# Patient Record
Sex: Female | Born: 1975 | Race: White | Hispanic: No | Marital: Married | Smoking: Former smoker
Health system: Southern US, Community
[De-identification: ages and names within clinical notes are randomized; demographics above are authoritative.]

## PROBLEM LIST (undated history)

## (undated) DIAGNOSIS — L719 Rosacea, unspecified: Secondary | ICD-10-CM

## (undated) DIAGNOSIS — E282 Polycystic ovarian syndrome: Secondary | ICD-10-CM

## (undated) DIAGNOSIS — N946 Dysmenorrhea, unspecified: Secondary | ICD-10-CM

## (undated) DIAGNOSIS — C439 Malignant melanoma of skin, unspecified: Secondary | ICD-10-CM

## (undated) DIAGNOSIS — O2230 Deep phlebothrombosis in pregnancy, unspecified trimester: Secondary | ICD-10-CM

## (undated) DIAGNOSIS — E079 Disorder of thyroid, unspecified: Secondary | ICD-10-CM

## (undated) DIAGNOSIS — N979 Female infertility, unspecified: Secondary | ICD-10-CM

## (undated) HISTORY — DX: Rosacea, unspecified: L71.9

## (undated) HISTORY — DX: Dysmenorrhea, unspecified: N94.6

## (undated) HISTORY — DX: Malignant melanoma of skin, unspecified: C43.9

## (undated) HISTORY — DX: Disorder of thyroid, unspecified: E07.9

## (undated) HISTORY — DX: Female infertility, unspecified: N97.9

## (undated) HISTORY — DX: Polycystic ovarian syndrome: E28.2

---

## 2005-10-19 ENCOUNTER — Other Ambulatory Visit: Admission: RE | Admit: 2005-10-19 | Discharge: 2005-10-19 | Payer: Self-pay | Admitting: Obstetrics and Gynecology

## 2006-09-19 DIAGNOSIS — E282 Polycystic ovarian syndrome: Secondary | ICD-10-CM

## 2006-09-19 DIAGNOSIS — N979 Female infertility, unspecified: Secondary | ICD-10-CM

## 2006-09-19 HISTORY — DX: Female infertility, unspecified: N97.9

## 2006-09-19 HISTORY — DX: Polycystic ovarian syndrome: E28.2

## 2009-04-29 ENCOUNTER — Inpatient Hospital Stay (HOSPITAL_COMMUNITY): Admission: AD | Admit: 2009-04-29 | Discharge: 2009-04-29 | Payer: Self-pay | Admitting: Obstetrics and Gynecology

## 2009-11-26 ENCOUNTER — Inpatient Hospital Stay (HOSPITAL_COMMUNITY): Admission: RE | Admit: 2009-11-26 | Discharge: 2009-11-29 | Payer: Self-pay | Admitting: Obstetrics and Gynecology

## 2010-09-19 HISTORY — PX: VEIN SURGERY: SHX48

## 2010-12-13 LAB — CBC
HCT: 33.5 % — ABNORMAL LOW (ref 36.0–46.0)
HCT: 38.1 % (ref 36.0–46.0)
Hemoglobin: 11.4 g/dL — ABNORMAL LOW (ref 12.0–15.0)
Hemoglobin: 12.8 g/dL (ref 12.0–15.0)
MCHC: 33.7 g/dL (ref 30.0–36.0)
MCV: 97.4 fL (ref 78.0–100.0)
Platelets: 155 10*3/uL (ref 150–400)
RDW: 14.1 % (ref 11.5–15.5)
WBC: 11.2 10*3/uL — ABNORMAL HIGH (ref 4.0–10.5)

## 2010-12-13 LAB — URINALYSIS, ROUTINE W REFLEX MICROSCOPIC
Bilirubin Urine: NEGATIVE
Glucose, UA: NEGATIVE mg/dL
Ketones, ur: NEGATIVE mg/dL
Protein, ur: NEGATIVE mg/dL

## 2010-12-13 LAB — CCBB MATERNAL DONOR DRAW

## 2010-12-13 LAB — URINE MICROSCOPIC-ADD ON

## 2010-12-26 LAB — URINALYSIS, DIPSTICK ONLY
Glucose, UA: NEGATIVE mg/dL
Hgb urine dipstick: NEGATIVE
Leukocytes, UA: NEGATIVE
Nitrite: NEGATIVE
Protein, ur: NEGATIVE mg/dL

## 2011-04-27 ENCOUNTER — Other Ambulatory Visit: Payer: Self-pay | Admitting: Internal Medicine

## 2011-04-27 DIAGNOSIS — E049 Nontoxic goiter, unspecified: Secondary | ICD-10-CM

## 2011-06-07 ENCOUNTER — Other Ambulatory Visit: Payer: Self-pay

## 2011-07-01 ENCOUNTER — Ambulatory Visit
Admission: RE | Admit: 2011-07-01 | Discharge: 2011-07-01 | Disposition: A | Discharge status: Home / Self Care | Payer: 59 | Source: Ambulatory Visit | Attending: Internal Medicine | Admitting: Internal Medicine

## 2011-07-01 DIAGNOSIS — E049 Nontoxic goiter, unspecified: Secondary | ICD-10-CM

## 2011-12-21 ENCOUNTER — Encounter (INDEPENDENT_AMBULATORY_CARE_PROVIDER_SITE_OTHER): Payer: 59 | Admitting: Surgery

## 2012-01-09 ENCOUNTER — Ambulatory Visit (INDEPENDENT_AMBULATORY_CARE_PROVIDER_SITE_OTHER): Payer: 59 | Admitting: General Surgery

## 2013-09-10 ENCOUNTER — Ambulatory Visit: Payer: Self-pay | Admitting: Obstetrics and Gynecology

## 2013-10-28 ENCOUNTER — Encounter: Payer: Self-pay | Admitting: Obstetrics and Gynecology

## 2013-11-12 ENCOUNTER — Ambulatory Visit (INDEPENDENT_AMBULATORY_CARE_PROVIDER_SITE_OTHER): Payer: 59 | Admitting: Obstetrics and Gynecology

## 2013-11-12 ENCOUNTER — Encounter: Payer: Self-pay | Admitting: Obstetrics and Gynecology

## 2013-11-12 VITALS — BP 125/73 | HR 64 | Resp 16 | Ht 66.25 in | Wt 166.0 lb

## 2013-11-12 DIAGNOSIS — Z01419 Encounter for gynecological examination (general) (routine) without abnormal findings: Secondary | ICD-10-CM

## 2013-11-12 DIAGNOSIS — E039 Hypothyroidism, unspecified: Secondary | ICD-10-CM

## 2013-11-12 NOTE — Patient Instructions (Signed)

## 2013-11-12 NOTE — Progress Notes (Signed)
GYNECOLOGY VISIT  PCP: [facility]  Referring provider:   HPI: 38 y.o.   Married  Caucasian  female   [phone] with Patient's last menstrual period was 11/07/2013.   here for   Annual Exam  Would welcome pregnancy.   First two days of menses with severe cramping.  Takes Tylenol. Allergy to ASA and Ibuprofen. Declines OCPs.  Daughter just diagnosed with Type I diabetes.  Hgb:  PCP Urine:  PCP  GYNECOLOGIC HISTORY: Patient's last menstrual period was 11/07/2013. Sexually active:  yes Partner preference: female Contraception:   Condoms Menopausal hormone therapy: no DES exposure:  no  Blood transfusions:   no Sexually transmitted diseases:   no GYN procedures and prior surgeries:  no Last mammogram:  no               Last pap and high risk HPV testing:  2013  Neg History of abnormal pap smear:  no   OB History   Grav Para Term Preterm Abortions TAB SAB Ect Mult Living   3 2 2  1  1   2        LIFESTYLE: Exercise:    Walking, Swimming, Zumba, Kickball           Tobacco: occ smoking Alcohol: no Drug use:  no  OTHER HEALTH MAINTENANCE: Tetanus/TDap: less than 10 years Gardisil: no Influenza no:   Zostavax: no  Bone density: no Colonoscopy: no  Cholesterol check: no  Family History  Problem Relation Age of Onset  . Thyroid disease Mother     HYPO  . Thyroid disease Father   . Graves' disease Father   . Diabetes Daughter     There are no active problems to display for this patient.  Past Medical History  Diagnosis Date  . Dysmenorrhea   . PCOS (polycystic ovarian syndrome) 2008  . Infertility, female 2008  . Thyroid disease     HYPOTHYOIDISM    Past Surgical History  Procedure Laterality Date  . Cesarean section  11/26/09  . Vein surgery Right 2012    ALLERGIES: Aspirin and Sulfa antibiotics  Current Outpatient Prescriptions  Medication Sig Dispense Refill  . SYNTHROID 112 MCG tablet        No current  facility-administered medications for this visit.     ROS:  Pertinent items are noted in HPI.  SOCIAL HISTORY:  Married.  Two children.  Artist.  Technical brewer.    PHYSICAL EXAMINATION:    BP 125/73  Pulse 64  Resp 16  Ht 5' 6.25" (1.683 m)  Wt 166 lb (75.297 kg)  BMI 26.58 kg/m2  LMP 11/07/2013   Wt Readings from Last 3 Encounters:  11/12/13 166 lb (75.297 kg)     Ht Readings from Last 3 Encounters:  11/12/13 5' 6.25" (1.683 m)    General appearance: alert, cooperative and appears stated age Head: Normocephalic, without obvious abnormality, atraumatic Neck: no adenopathy, supple, symmetrical, trachea midline and thyroid not enlarged, symmetric, no tenderness/mass/nodules Lungs: clear to auscultation bilaterally Breasts: Inspection negative, No nipple retraction or dimpling, No nipple discharge or bleeding, No axillary or supraclavicular adenopathy, Normal to palpation without dominant masses Heart: regular rate and rhythm Abdomen: soft, non-tender; no masses,  no organomegaly Extremities: extremities normal, atraumatic, no cyanosis or edema Skin: Skin color, texture, turgor normal. No rashes or lesions Lymph nodes: Cervical, supraclavicular, and axillary nodes normal. No abnormal inguinal nodes palpated Neurologic: Grossly normal  Pelvic: External genitalia:  no lesions  Urethra:  normal appearing urethra with no masses, tenderness or lesions              Bartholins and Skenes: normal                 Vagina: normal appearing vagina with normal color and discharge, no lesions              Cervix: normal appearance              Pap and high risk HPV testing done: yes.            Bimanual Exam:  Uterus:  uterus is normal size, shape, consistency and nontender                                      Adnexa: normal adnexa in size, nontender and no masses                                        ASSESSMENT  Normal gynecologic exam. Hypothyroidism.   Dysmenorrhea.  Declines OCPS.  PLAN  Mammogram recommended yearly.  Pap smear and high risk HPV testing Counseled on self breast exam, Calcium and vitamin D intake, exercise. Start PNV. Referral to Dr. Roque Cash for hypothyroidism management.   Will do labs at visit with Dr. Forde Dandy.  Return annually or prn   An After Visit Summary was printed and given to the patient.

## 2013-11-13 ENCOUNTER — Telehealth: Payer: Self-pay | Admitting: Obstetrics and Gynecology

## 2013-11-13 NOTE — Telephone Encounter (Signed)
Advised patient that she is scheduled with Dr.South on March 18 @ 2pm.3  Patient agreeable.

## 2013-11-14 LAB — IPS PAP TEST WITH HPV

## 2014-05-23 ENCOUNTER — Telehealth: Payer: Self-pay | Admitting: Obstetrics and Gynecology

## 2014-05-23 NOTE — Telephone Encounter (Signed)
Spoke with patient. She was just seen at urgent care and givenabx for what appears to be an insect bite on her breast. She is unsure what antibiotic was given and for how long she must take it as she is currently waiting for rx.  Advised patient that Dr. Quincy Simmonds can assess breast for follow up. Patient will start antibiotics today. Scheduled patient for office visit for follow up with Dr. Quincy Simmonds on 05/29/14 at 1630. Patient will return call if symptoms worsen or develops fevers, redness or any other concern, verbalized understanding of instructions.   Routing to provider for final review. Patient agreeable to disposition. Will close encounter

## 2014-05-23 NOTE — Telephone Encounter (Signed)
Patient just went to urgent care for a mark on her breast, was given an antibiotic. Patient was told to seek follow up care. Patient is asking if it is appropriate to come to her GYN for this issue?

## 2014-05-29 ENCOUNTER — Encounter: Payer: Self-pay | Admitting: Obstetrics and Gynecology

## 2014-05-29 ENCOUNTER — Ambulatory Visit (INDEPENDENT_AMBULATORY_CARE_PROVIDER_SITE_OTHER): Payer: BC Managed Care – PPO | Admitting: Obstetrics and Gynecology

## 2014-05-29 VITALS — BP 118/78 | HR 72 | Ht 66.0 in | Wt 171.0 lb

## 2014-05-29 DIAGNOSIS — D229 Melanocytic nevi, unspecified: Secondary | ICD-10-CM

## 2014-05-29 DIAGNOSIS — D239 Other benign neoplasm of skin, unspecified: Secondary | ICD-10-CM

## 2014-05-29 DIAGNOSIS — L0291 Cutaneous abscess, unspecified: Secondary | ICD-10-CM

## 2014-05-29 DIAGNOSIS — L0390 Cellulitis, unspecified: Secondary | ICD-10-CM

## 2014-05-29 NOTE — Progress Notes (Signed)
GYNECOLOGY VISIT  PCP:   Referring provider:   HPI: 38 y.o.   Married  Caucasian  female   [phone] with Patient's last menstrual period was 05/20/2014.   here for   Left Breast check (possible spider bite) Went to urgent care and received doxycycline to treat skin infection.  Feeling better.  Shows me a photo of red streaking of the left breast and a ring of erythema around the areola.   Wants to see dermatology for a mole check of left breast and face.  States she has a history of abnormal moles - dysplasia versus cancer of the skin.  Family practitioner removed the moles.   Hgb:  no Urine:  no  GYNECOLOGIC HISTORY: Patient's last menstrual period was 05/20/2014. Sexually active:  yes Partner preference: female Contraception:   No Menopausal hormone therapy: no  DES exposure:   no Blood transfusions:no    Sexually transmitted diseases: never    GYN procedures and prior surgeries: no  Last mammogram:   never              Last pap and high risk HPV testing:   11/12/13  Neg HR HPV History of abnormal pap smear:  no   OB History   Grav Para Term Preterm Abortions TAB SAB Ect Mult Living   3 2 2  1  1   2        LIFESTYLE: Exercise: very active              Tobacco:  no Alcohol:no Drug use:no    There are no active problems to display for this patient.   Past Medical History  Diagnosis Date  . Dysmenorrhea   . PCOS (polycystic ovarian syndrome) 2008  . Infertility, female 2008  . Thyroid disease     HYPOTHYOIDISM    Past Surgical History  Procedure Laterality Date  . Cesarean section  11/26/09  . Vein surgery Right 2012    Current Outpatient Prescriptions  Medication Sig Dispense Refill  . levothyroxine (SYNTHROID, LEVOTHROID) 137 MCG tablet Take 137 mcg by mouth daily before breakfast.       No current facility-administered medications for this visit.     ALLERGIES: Advil; Aspirin; and Sulfa antibiotics  Family History  Problem Relation Age of Onset   . Thyroid disease Mother     HYPO  . Thyroid disease Father   . Graves' disease Father   . Diabetes Daughter     History   Social History  . Marital Status: Married    Spouse Name: N/A    Number of Children: N/A  . Years of Education: N/A   Occupational History  . Not on file.   Social History Main Topics  . Smoking status: Current Some Day Smoker  . Smokeless tobacco: Never Used  . Alcohol Use: No  . Drug Use: No  . Sexual Activity: Yes    Partners: Female    Birth Control/ Protection: Condom   Other Topics Concern  . Not on file   Social History Narrative  . No narrative on file    ROS:  Pertinent items are noted in HPI.  PHYSICAL EXAMINATION:    BP 118/78  Pulse 72  Ht 5\' 6"  (1.676 m)  Wt 171 lb (77.565 kg)  BMI 27.61 kg/m2  LMP 05/20/2014   Wt Readings from Last 3 Encounters:  05/29/14 171 lb (77.565 kg)  11/12/13 166 lb (75.297 kg)     Ht Readings from Last 3  Encounters:  05/29/14 5\' 6"  (1.676 m)  11/12/13 5' 6.25" (1.683 m)    General appearance: alert, cooperative and appears stated age Breasts: 1 cm red cracked, smooth dry skin at 3 o'clock, 4 mm irregular dark brown area adjacent to that area.  No nipple retraction or dimpling, No nipple discharge or bleeding, No axillary or supraclavicular adenopathy, Normal to palpation without dominant masses   ASSESSMENT  Cellulitis of left breast.  Improved.  Pigmented nevus of the left breast.  History of dysplasia of the skin versus skin cancer.  PLAN  Finish doxycycline.  Referral to Dr. Crista Luria.  Return prn.   An After Visit Summary was printed and given to the patient.   15 minutes face to face time of which over 50% was spent in counseling.

## 2014-05-29 NOTE — Patient Instructions (Signed)
Please call us if you do not hear from Dr. Marisue Ivan office or our office in 10 days.

## 2014-06-09 ENCOUNTER — Telehealth: Payer: Self-pay | Admitting: Obstetrics and Gynecology

## 2014-06-09 NOTE — Telephone Encounter (Signed)
Spoke with patient regarding her dermatology referral. Patient is scheduled 09.30.2015 @ 1345. Patient agreeable.

## 2014-07-21 ENCOUNTER — Encounter: Payer: Self-pay | Admitting: Obstetrics and Gynecology

## 2014-11-19 ENCOUNTER — Ambulatory Visit: Payer: 59 | Admitting: Obstetrics and Gynecology

## 2014-11-19 ENCOUNTER — Ambulatory Visit (INDEPENDENT_AMBULATORY_CARE_PROVIDER_SITE_OTHER): Payer: BLUE CROSS/BLUE SHIELD | Admitting: Obstetrics and Gynecology

## 2014-11-19 ENCOUNTER — Encounter: Payer: Self-pay | Admitting: Obstetrics and Gynecology

## 2014-11-19 VITALS — BP 110/74 | HR 68 | Resp 14 | Ht 66.0 in | Wt 175.2 lb

## 2014-11-19 DIAGNOSIS — Z01419 Encounter for gynecological examination (general) (routine) without abnormal findings: Secondary | ICD-10-CM | POA: Diagnosis not present

## 2014-11-19 DIAGNOSIS — Z0000 Encounter for general adult medical examination without abnormal findings: Secondary | ICD-10-CM | POA: Diagnosis not present

## 2014-11-19 LAB — POCT URINALYSIS DIPSTICK
BILIRUBIN UA: NEGATIVE
GLUCOSE UA: NEGATIVE
KETONES UA: NEGATIVE
LEUKOCYTES UA: NEGATIVE
Nitrite, UA: NEGATIVE
PH UA: 5
Protein, UA: NEGATIVE
RBC UA: NEGATIVE
Urobilinogen, UA: NEGATIVE

## 2014-11-19 NOTE — Patient Instructions (Signed)

## 2014-11-19 NOTE — Progress Notes (Signed)
Patient ID: Angel Mills, female   DOB: September 22, 1975, 39 y.o.   MRN: [id number] 39 y.o. [id number] MarriedCaucasianF here for annual exam.   PCP:  Dr. Annie Main South(endocrinologist) Not remembering her Synthroid.  Due to see Dr. Forde Dandy.  No contraception.  Sometimes condoms and spermicide.  [place] if pregnancy occurs.   Continues to have difficult menses.  If looses weight, has less bleeding and less pain.  No NSAIDs due to anaphylaxis with Advil and ASA.  Quit tobacco in October 2015.   Had benign mole removal from breast. [facility].   Daughter doing better with diabetes.   Patient's last menstrual period was 11/13/2014 (exact date).          Sexually active: Yes.   female partner The current method of family planning is none.    Exercising: Yes.    walking, swimming and biking. Smoker:  Former smoker--quit 06/2014  Health Maintenance: Pap:  11-12-13 wnl:neg HR HPV History of abnormal Pap:  no MMG:  n/a Colonoscopy:  n/a BMD:   n/a TDaP:  Within past 8 years Screening Labs:   Hb today: with Endocrine, Urine today: Neg    reports that she has been smoking.  She has never used smokeless tobacco. She reports that she does not drink alcohol or use illicit drugs.  Past Medical History  Diagnosis Date  . Dysmenorrhea   . PCOS (polycystic ovarian syndrome) 2008  . Infertility, female 2008  . Thyroid disease     HYPOTHYOIDISM    Past Surgical History  Procedure Laterality Date  . Cesarean section  11/26/09  . Vein surgery Right 2012    Current Outpatient Prescriptions  Medication Sig Dispense Refill  . levothyroxine (SYNTHROID, LEVOTHROID) 137 MCG tablet Take 137 mcg by mouth daily before breakfast.     No current facility-administered medications for this visit.    Family History  Problem Relation Age of Onset  . Thyroid disease Mother     HYPO  . Thyroid disease Father   . Graves' disease Father   . Diabetes Daughter     ROS:  Pertinent items are noted  in HPI.  Otherwise, a comprehensive ROS was negative.  Exam:   BP 110/74 mmHg  Pulse 68  Resp 14  Ht 5\' 6"  (1.676 m)  Wt 175 lb 3.2 oz (79.47 kg)  BMI 28.29 kg/m2  LMP 11/13/2014 (Exact Date)      Height: 5\' 6"  (167.6 cm)  Ht Readings from Last 3 Encounters:  11/19/14 5\' 6"  (1.676 m)  05/29/14 5\' 6"  (1.676 m)  11/12/13 5' 6.25" (1.683 m)    General appearance: alert, cooperative and appears stated age Head: Normocephalic, without obvious abnormality, atraumatic Neck: no adenopathy, supple, symmetrical, trachea midline and thyroid enlarged Lungs: clear to auscultation bilaterally Breasts: normal appearance, no masses or tenderness, Inspection negative, No nipple retraction or dimpling, No nipple discharge or bleeding, No axillary or supraclavicular adenopathy Heart: regular rate and rhythm Abdomen: soft, non-tender; bowel sounds normal; no masses,  no organomegaly Extremities: extremities normal, atraumatic, no cyanosis or edema Skin: Skin color, texture, turgor normal. No rashes or lesions Lymph nodes: Cervical, supraclavicular, and axillary nodes normal. No abnormal inguinal nodes palpated Neurologic: Grossly normal   Pelvic: External genitalia:  no lesions              Urethra:  normal appearing urethra with no masses, tenderness or lesions              Bartholins and Skenes: normal  Vagina: normal appearing vagina with normal color and discharge, no lesions              Cervix: no lesions              Pap taken: No. Bimanual Exam:  Uterus:  normal size, contour, position, consistency, mobility, non-tender              Adnexa: normal adnexa and no mass, fullness, tenderness               Rectovaginal: Confirms               Anus:  normal sphincter tone, no lesions  Chaperone was present for exam.  A:  Well Woman with normal exam Dysmenorrhea and heavy menstrual cycles.  Anaphylaxis with Advil and ASA. Desire for potential future pregnancy.  History of  C/S.  Goiter and hypothyroidism.   P:   Mammogram age 13.  pap smear not indicated.  Tylenol and heat for painful menses.  Discussed contraception for pregnancy prevention, dysmenorrhea, and reduction of menstrual flow when the time is right for her.  Start PNV.  Take Synthroid daily and set alarm.  Follow up with Dr. Forde Dandy.   return annually or prn

## 2015-12-09 ENCOUNTER — Ambulatory Visit (INDEPENDENT_AMBULATORY_CARE_PROVIDER_SITE_OTHER): Payer: 59 | Admitting: Obstetrics and Gynecology

## 2015-12-09 ENCOUNTER — Encounter: Payer: Self-pay | Admitting: Obstetrics and Gynecology

## 2015-12-09 VITALS — BP 112/74 | HR 86 | Resp 16 | Ht 66.25 in | Wt 183.0 lb

## 2015-12-09 DIAGNOSIS — Z01419 Encounter for gynecological examination (general) (routine) without abnormal findings: Secondary | ICD-10-CM

## 2015-12-09 MED ORDER — NORETHINDRONE 0.35 MG PO TABS: 1.0000 | ORAL_TABLET | Freq: Every day | ORAL | Status: DC

## 2015-12-09 NOTE — Progress Notes (Signed)
Patient ID: Angel Mills, female   DOB: [birth year], 40 y.o.   MRN: [id number] 40 y.o. [id number] Married Caucasian female here for annual exam.    Desires contraception.  Would like a birth control pill.  Worried about weight gain.  Stopped smoking 1.5 years ago.   Painful menses every other month.  Menses painful and heavy for the first 2 days.   States she believes she had a groin blood clot following vein surgery in the right leg. No records in her chart.  Stress due to DM of daughter.   PCP:  Dr. Forde Dandy - will do labs with Dr. Forde Dandy.   Patient's last menstrual period was 11/27/2015.          Sexually active: Yes.    The current method of family planning is condoms most of the time.    Exercising: Yes.    walk, swim, bike ride 7x/wk Smoker:  no  Health Maintenance: Pap: 11-12-13 Neg:Neg HR HPV History of abnormal Pap:  no MMG:  n/a Colonoscopy:  n/a BMD:   n/a  Result  n/a TDaP:  05/2005 Screening Labs:    Urine today:  Declined.   reports that she has been smoking.  She has never used smokeless tobacco. She reports that she does not drink alcohol or use illicit drugs.  Past Medical History  Diagnosis Date  . Dysmenorrhea   . PCOS (polycystic ovarian syndrome) 2008  . Infertility, female 2008  . Thyroid disease     HYPOTHYOIDISM    Past Surgical History  Procedure Laterality Date  . Cesarean section  11/26/09  . Vein surgery Right 2012    states she had clot in groin following this.    Current Outpatient Prescriptions  Medication Sig Dispense Refill  . levothyroxine (SYNTHROID, LEVOTHROID) 137 MCG tablet Take 137 mcg by mouth daily before breakfast.    . norethindrone (MICRONOR,CAMILA,ERRIN) 0.35 MG tablet Take 1 tablet (0.35 mg total) by mouth daily. 3 Package 3   No current facility-administered medications for this visit.    Family History  Problem Relation Age of Onset  . Thyroid disease Mother     HYPO  . Thyroid disease Father   . Graves' disease  Father   . Diabetes Daughter     ROS:  Pertinent items are noted in HPI.  Otherwise, a comprehensive ROS was negative.  Exam:   BP 112/74 mmHg  Pulse 86  Resp 16  Ht 5' 6.25" (1.683 m)  Wt 183 lb (83.008 kg)  BMI 29.31 kg/m2  LMP 11/27/2015    General appearance: alert, cooperative and appears stated age Head: Normocephalic, without obvious abnormality, atraumatic Neck: no adenopathy, supple, symmetrical, trachea midline and thyroid enlarged and soft, nontender. Lungs: clear to auscultation bilaterally Breasts: normal appearance, no masses or tenderness, Inspection negative, No nipple retraction or dimpling, No nipple discharge or bleeding, No axillary or supraclavicular adenopathy Heart: regular rate and rhythm Abdomen: soft, non-tender; bowel sounds normal; no masses,  no organomegaly Extremities: extremities normal, atraumatic, no cyanosis or edema Skin: Skin color, texture, turgor normal. No rashes or lesions Lymph nodes: Cervical, supraclavicular, and axillary nodes normal. No abnormal inguinal nodes palpated Neurologic: Grossly normal  Pelvic: External genitalia:  no lesions              Urethra:  normal appearing urethra with no masses, tenderness or lesions              Bartholins and Skenes: normal  Vagina: normal appearing vagina with normal color and discharge, no lesions              Cervix: no lesions              Pap taken: No. Bimanual Exam:  Uterus:  normal size, contour, position, consistency, mobility, non-tender              Adnexa: normal adnexa and no mass, fullness, tenderness              Rectovaginal: No..     Chaperone was present for exam.  Assessment:   Well woman visit with normal exam. Dysmenorrhea and heavy menstrual cycles.  Anaphylaxis with Advil and ASA. History of C/S.  Goiter and hypothyroidism.  Potential hx of thromboembolic event.   Plan: Yearly mammogram recommended after age 83.  Pap and HR HPV as  above. Discussed Calcium, Vitamin D, regular exercise program including cardiovascular and weight bearing exercise. Labs performed.  No..   See orders. Refills given on medications.  Yes.  .  See orders.  Micronor.  Instructed in side effects and proper use.  Get records from [facility] regarding thrombosis. Follow up annually and prn.      After visit summary provided.

## 2015-12-09 NOTE — Patient Instructions (Signed)
EXERCISE AND DIET:  We recommended that you start or continue a regular exercise program for good health. Regular exercise means any activity that makes your heart beat faster and makes you sweat.  We recommend exercising at least 30 minutes per day at least 3 days a week, preferably 4 or 5.  We also recommend a diet low in fat and sugar.  Inactivity, poor dietary choices and obesity can cause diabetes, heart attack, stroke, and kidney damage, among others.    ALCOHOL AND SMOKING:  Women should limit their alcohol intake to no more than 7 drinks/beers/glasses of wine (combined, not each!) per week. Moderation of alcohol intake to this level decreases your risk of breast cancer and liver damage. And of course, no recreational drugs are part of a healthy lifestyle.  And absolutely no smoking or even second hand smoke. Most people know smoking can cause heart and lung diseases, but did you know it also contributes to weakening of your bones? Aging of your skin?  Yellowing of your teeth and nails?  CALCIUM AND VITAMIN D:  Adequate intake of calcium and Vitamin D are recommended.  The recommendations for exact amounts of these supplements seem to change often, but generally speaking 600 mg of calcium (either carbonate or citrate) and 800 units of Vitamin D per day seems prudent. Certain women may benefit from higher intake of Vitamin D.  If you are among these women, your doctor will have told you during your visit.    PAP SMEARS:  Pap smears, to check for cervical cancer or precancers,  have traditionally been done yearly, although recent scientific advances have shown that most women can have pap smears less often.  However, every woman still should have a physical exam from her gynecologist every year. It will include a breast check, inspection of the vulva and vagina to check for abnormal growths or skin changes, a visual exam of the cervix, and then an exam to evaluate the size and shape of the uterus and  ovaries.  And after 40 years of age, a rectal exam is indicated to check for rectal cancers. We will also provide age appropriate advice regarding health maintenance, like when you should have certain vaccines, screening for sexually transmitted diseases, bone density testing, colonoscopy, mammograms, etc.   MAMMOGRAMS:  All women over 40 years old should have a yearly mammogram. Many facilities now offer a "3D" mammogram, which may cost around $50 extra out of pocket. If possible,  we recommend you accept the option to have the 3D mammogram performed.  It both reduces the number of women who will be called back for extra views which then turn out to be normal, and it is better than the routine mammogram at detecting truly abnormal areas.    COLONOSCOPY:  Colonoscopy to screen for colon cancer is recommended for all women at age 50.  We know, you hate the idea of the prep.  We agree, BUT, having colon cancer and not knowing it is worse!!  Colon cancer so often starts as a polyp that can be seen and removed at colonscopy, which can quite literally save your life!  And if your first colonoscopy is normal and you have no family history of colon cancer, most women don't have to have it again for 10 years.  Once every ten years, you can do something that may end up saving your life, right?  We will be happy to help you get it scheduled when you are ready.    Be sure to check your insurance coverage so you understand how much it will cost.  It may be covered as a preventative service at no cost, but you should check your particular policy.     Norethindrone tablets (contraception) What is this medicine? NORETHINDRONE (nor eth IN drone) is an oral contraceptive. The product contains a female hormone known as a progestin. It is used to prevent pregnancy. This medicine may be used for other purposes; ask your health care provider or pharmacist if you have questions. What should I tell my health care provider before  I take this medicine? They need to know if you have any of these conditions: -blood vessel disease or blood clots -breast, cervical, or vaginal cancer -diabetes -heart disease -kidney disease -liver disease -mental depression -migraine -seizures -stroke -vaginal bleeding -an unusual or allergic reaction to norethindrone, other medicines, foods, dyes, or preservatives -pregnant or trying to get pregnant -breast-feeding How should I use this medicine? Take this medicine by mouth with a glass of water. You may take it with or without food. Follow the directions on the prescription label. Take this medicine at the same time each day and in the order directed on the package. Do not take your medicine more often than directed. Contact your pediatrician regarding the use of this medicine in children. Special care may be needed. This medicine has been used in female children who have started having menstrual periods. A patient package insert for the product will be given with each prescription and refill. Read this sheet carefully each time. The sheet may change frequently. Overdosage: If you think you have taken too much of this medicine contact a poison control center or emergency room at once. NOTE: This medicine is only for you. Do not share this medicine with others. What if I miss a dose? Try not to miss a dose. Every time you miss a dose or take a dose late your chance of pregnancy increases. When 1 pill is missed (even if only 3 hours late), take the missed pill as soon as possible and continue taking a pill each day at the regular time (use a back up method of birth control for the next 48 hours). If more than 1 dose is missed, use an additional birth control method for the rest of your pill pack until menses occurs. Contact your health care professional if more than 1 dose has been missed. What may interact with this medicine? Do not take this medicine with any of the following  medications: -amprenavir or fosamprenavir -bosentan This medicine may also interact with the following medications: -antibiotics or medicines for infections, especially rifampin, rifabutin, rifapentine, and griseofulvin, and possibly penicillins or tetracyclines -aprepitant -barbiturate medicines, such as phenobarbital -carbamazepine -felbamate -modafinil -oxcarbazepine -phenytoin -ritonavir or other medicines for HIV infection or AIDS -St. John's wort -topiramate This list may not describe all possible interactions. Give your health care provider a list of all the medicines, herbs, non-prescription drugs, or dietary supplements you use. Also tell them if you smoke, drink alcohol, or use illegal drugs. Some items may interact with your medicine. What should I watch for while using this medicine? Visit your doctor or health care professional for regular checks on your progress. You will need a regular breast and pelvic exam and Pap smear while on this medicine. Use an additional method of birth control during the first cycle that you take these tablets. If you have any reason to think you are pregnant, stop taking this medicine  right away and contact your doctor or health care professional. If you are taking this medicine for hormone related problems, it may take several cycles of use to see improvement in your condition. This medicine does not protect you against HIV infection (AIDS) or any other sexually transmitted diseases. What side effects may I notice from receiving this medicine? Side effects that you should report to your doctor or health care professional as soon as possible: -breast tenderness or discharge -pain in the abdomen, chest, groin or leg -severe headache -skin rash, itching, or hives -sudden shortness of breath -unusually weak or tired -vision or speech problems -yellowing of skin or eyes Side effects that usually do not require medical attention (report to your  doctor or health care professional if they continue or are bothersome): -changes in sexual desire -change in menstrual flow -facial hair growth -fluid retention and swelling -headache -irritability -nausea -weight gain or loss This list may not describe all possible side effects. Call your doctor for medical advice about side effects. You may report side effects to FDA at 1-800-FDA-1088. Where should I keep my medicine? Keep out of the reach of children. Store at room temperature between 15 and 30 degrees C (59 and 86 degrees F). Throw away any unused medicine after the expiration date. NOTE: This sheet is a summary. It may not cover all possible information. If you have questions about this medicine, talk to your doctor, pharmacist, or health care provider.    2016, Elsevier/Gold Standard. (2012-05-25 16:41:35)

## 2015-12-16 ENCOUNTER — Telehealth: Payer: Self-pay | Admitting: Obstetrics and Gynecology

## 2015-12-16 NOTE — Telephone Encounter (Signed)
I have opened a phone note so you can contact the patient.  I received her records from [facility] and [facility].   She has NOT had a deep venous thrombosis during her prior care with them.   Records will be scanned in to [facility].   Thank you,   Josefa Half

## 2015-12-17 NOTE — Telephone Encounter (Signed)
Spoke with patient. Advised of message as seen below from [facility]. She is agreeable and verbalizes understanding.  Routing to provider for final review. Patient agreeable to disposition. Will close encounter.

## 2016-11-23 DIAGNOSIS — E038 Other specified hypothyroidism: Secondary | ICD-10-CM | POA: Diagnosis not present

## 2016-11-23 DIAGNOSIS — E049 Nontoxic goiter, unspecified: Secondary | ICD-10-CM | POA: Diagnosis not present

## 2016-11-23 DIAGNOSIS — Z1389 Encounter for screening for other disorder: Secondary | ICD-10-CM | POA: Diagnosis not present

## 2016-11-29 ENCOUNTER — Other Ambulatory Visit: Payer: Self-pay | Admitting: Obstetrics and Gynecology

## 2016-11-29 NOTE — Telephone Encounter (Signed)
Medication refill request: Norethindrone Last AEX:  12/09/15 BS Next AEX: 12/21/16 BS Last MMG (if hormonal medication request): n/a Refill authorized: 12/09/15 #3 Package 3R. Please advise. Thank you.

## 2016-12-15 NOTE — Progress Notes (Signed)
41 y.o. G38P2012 Married Caucasian female here for annual exam.    Last cycle was really heavy at one point and passed a fleshy colored piece of tissue with clot? She did not look very carefully.  Did miss a couple of pills in the prior month.  Can be late here and there, afternoon instead of morning. Dos sometimes use condoms for back up.   Not interested in Mirena. Friends have had problems. Not sexually active very often.   Daughter is 36 years old.   PCP:  Reynold Bowen, MD  Patient's last menstrual period was 12/01/2016 (exact date).     Period Cycle (Days): 30 Period Duration (Days): 5-6 days Period Pattern: Regular Menstrual Flow: Heavy (heavy first 2-3 days) Menstrual Control: Maxi pad Menstrual Control Change Freq (Hours): every hour on heaviest day Dysmenorrhea: (!) Moderate Dysmenorrhea Symptoms: Cramping, Headache     Sexually active: Yes.    The current method of family planning is oral progesterone-only contraceptive--Nora-Be.    Exercising: Yes.    Walking, dancing, swimming Smoker:  no  Health Maintenance: Pap: 11-12-13 Neg:Neg HR HPV;2013 Neg History of abnormal Pap:  no MMG:  n/a Colonoscopy:  n/a BMD:   n/a  Result  n/a TDaP: pt.thinks within last 8-9 years ago Gardasil:   N/A HIV: neg in pregnancy.    Screening Labs:  Hb today: Dr.South, Urine today: not done   reports that she quit smoking about 2 weeks ago. Her smoking use included Cigarettes. She has never used smokeless tobacco. She reports that she does not drink alcohol or use drugs.  Past Medical History:  Diagnosis Date  . Dysmenorrhea   . Infertility, female 2008  . PCOS (polycystic ovarian syndrome) 2008  . Thyroid disease    HYPOTHYOIDISM    Past Surgical History:  Procedure Laterality Date  . CESAREAN SECTION  11/26/09  . VEIN SURGERY Right 2012   states she had clot in groin following this.    Current Outpatient Prescriptions  Medication Sig Dispense Refill  . BELVIQ XR 20  MG TB24 Take 1 tablet by mouth at bedtime.  5  . levothyroxine (SYNTHROID, LEVOTHROID) 137 MCG tablet Take 137 mcg by mouth daily before breakfast.    . NORA-BE 0.35 MG tablet TAKE 1 TABLET BY MOUTH DAILY 28 tablet 0   No current facility-administered medications for this visit.     Family History  Problem Relation Age of Onset  . Thyroid disease Mother     HYPO  . Thyroid disease Father   . Graves' disease Father   . Cancer Father 25    Kidney cancer  . Diabetes Daughter     ROS:  Pertinent items are noted in HPI.  Otherwise, a comprehensive ROS was negative.  Exam:   BP 118/62 (BP Location: Right Arm, Patient Position: Sitting, Cuff Size: Normal)   Pulse 70   Resp 16   Ht 5\' 6"  (1.676 m)   Wt 178 lb 9.6 oz (81 kg)   LMP 12/01/2016 (Exact Date)   BMI 28.83 kg/m     General appearance: alert, cooperative and appears stated age Head: Normocephalic, without obvious abnormality, atraumatic Neck: no adenopathy, supple, symmetrical, trachea midline and thyroid globally enlarged slightly.  Lungs: clear to auscultation bilaterally Breasts: normal appearance, no masses or tenderness, No nipple retraction or dimpling, No nipple discharge or bleeding, No axillary or supraclavicular adenopathy Heart: regular rate and rhythm Abdomen: soft, non-tender; no masses, no organomegaly Extremities: extremities normal, atraumatic, no cyanosis or  edema Skin: Skin color, texture, turgor normal. No rashes or lesions Lymph nodes: Cervical, supraclavicular, and axillary nodes normal. No abnormal inguinal nodes palpated Neurologic: Grossly normal  Pelvic: External genitalia:  no lesions              Urethra:  normal appearing urethra with no masses, tenderness or lesions              Bartholins and Skenes: normal                 Vagina: normal appearing vagina with normal color and discharge, no lesions              Cervix: no lesions              Pap taken: Yes.   Bimanual Exam:  Uterus:   normal size, contour, position, consistency, mobility, non-tender              Adnexa: no mass, fullness, tenderness              Chaperone was present for exam.  Assessment:   Well woman visit with normal exam. Dysmenorrhea.  Controlled with Micronor.  Taking Micronor inconsistently.  Anaphylaxis with Advil and ASA. History of C/S.  Goiter and hypothyroidism.  Potential hx of thromboembolic event.   Plan: Mammogram screening discussed.  Facilities and contact information given.  She will schedule. Recommended self breast awareness. Pap and HR HPV as above. Guidelines for Calcium, Vitamin D, regular exercise program including cardiovascular and weight bearing exercise. We discussed need for taking Micronor at same time daily for pregnancy prevention.  I will refill this for one year.  We discussed Mirena as an alternative.  Risks and benefits reviewed.  Brochure given.  She will consider.  Follow up annually and prn.   After visit summary provided.

## 2016-12-21 ENCOUNTER — Ambulatory Visit (INDEPENDENT_AMBULATORY_CARE_PROVIDER_SITE_OTHER): Payer: 59 | Admitting: Obstetrics and Gynecology

## 2016-12-21 ENCOUNTER — Encounter: Payer: Self-pay | Admitting: Obstetrics and Gynecology

## 2016-12-21 VITALS — BP 118/62 | HR 70 | Resp 16 | Ht 66.0 in | Wt 178.6 lb

## 2016-12-21 DIAGNOSIS — Z01419 Encounter for gynecological examination (general) (routine) without abnormal findings: Secondary | ICD-10-CM | POA: Diagnosis not present

## 2016-12-21 MED ORDER — NORETHINDRONE 0.35 MG PO TABS: 1.0000 | ORAL_TABLET | Freq: Every day | ORAL | 3 refills | Status: DC

## 2016-12-21 NOTE — Patient Instructions (Signed)

## 2016-12-23 LAB — IPS PAP TEST WITH HPV

## 2017-05-29 DIAGNOSIS — R05 Cough: Secondary | ICD-10-CM | POA: Diagnosis not present

## 2017-05-29 DIAGNOSIS — J0140 Acute pansinusitis, unspecified: Secondary | ICD-10-CM | POA: Diagnosis not present

## 2017-06-12 DIAGNOSIS — Z1389 Encounter for screening for other disorder: Secondary | ICD-10-CM | POA: Diagnosis not present

## 2017-06-12 DIAGNOSIS — E038 Other specified hypothyroidism: Secondary | ICD-10-CM | POA: Diagnosis not present

## 2017-06-12 DIAGNOSIS — E048 Other specified nontoxic goiter: Secondary | ICD-10-CM | POA: Diagnosis not present

## 2017-07-09 DIAGNOSIS — N390 Urinary tract infection, site not specified: Secondary | ICD-10-CM | POA: Diagnosis not present

## 2017-09-05 DIAGNOSIS — D485 Neoplasm of uncertain behavior of skin: Secondary | ICD-10-CM | POA: Diagnosis not present

## 2017-09-05 DIAGNOSIS — D1800 Hemangioma unspecified site: Secondary | ICD-10-CM | POA: Diagnosis not present

## 2017-09-05 DIAGNOSIS — D224 Melanocytic nevi of scalp and neck: Secondary | ICD-10-CM | POA: Diagnosis not present

## 2017-09-05 DIAGNOSIS — D034 Melanoma in situ of scalp and neck: Secondary | ICD-10-CM | POA: Diagnosis not present

## 2017-09-05 DIAGNOSIS — L719 Rosacea, unspecified: Secondary | ICD-10-CM | POA: Diagnosis not present

## 2017-09-19 DIAGNOSIS — C439 Malignant melanoma of skin, unspecified: Secondary | ICD-10-CM

## 2017-09-19 HISTORY — DX: Malignant melanoma of skin, unspecified: C43.9

## 2017-09-27 DIAGNOSIS — D034 Melanoma in situ of scalp and neck: Secondary | ICD-10-CM | POA: Diagnosis not present

## 2017-09-27 DIAGNOSIS — L905 Scar conditions and fibrosis of skin: Secondary | ICD-10-CM | POA: Diagnosis not present

## 2017-11-16 DIAGNOSIS — D225 Melanocytic nevi of trunk: Secondary | ICD-10-CM | POA: Diagnosis not present

## 2017-11-16 DIAGNOSIS — L821 Other seborrheic keratosis: Secondary | ICD-10-CM | POA: Diagnosis not present

## 2017-11-16 DIAGNOSIS — D2272 Melanocytic nevi of left lower limb, including hip: Secondary | ICD-10-CM | POA: Diagnosis not present

## 2017-11-16 DIAGNOSIS — D485 Neoplasm of uncertain behavior of skin: Secondary | ICD-10-CM | POA: Diagnosis not present

## 2017-11-27 ENCOUNTER — Other Ambulatory Visit: Payer: Self-pay | Admitting: Obstetrics and Gynecology

## 2017-12-10 ENCOUNTER — Other Ambulatory Visit: Payer: Self-pay | Admitting: Obstetrics and Gynecology

## 2017-12-11 ENCOUNTER — Other Ambulatory Visit: Payer: Self-pay | Admitting: Obstetrics and Gynecology

## 2017-12-11 DIAGNOSIS — E048 Other specified nontoxic goiter: Secondary | ICD-10-CM | POA: Diagnosis not present

## 2017-12-11 DIAGNOSIS — E038 Other specified hypothyroidism: Secondary | ICD-10-CM | POA: Diagnosis not present

## 2017-12-11 NOTE — Telephone Encounter (Signed)
Medication refill request: OCP (90 day supply) - refilled for 30 days patient requesting #90   Last AEX:  12-21-16  Next AEX: 12-22-17  Last MMG (if hormonal medication request): none on file Refill authorized: Please advise

## 2017-12-11 NOTE — Telephone Encounter (Signed)
Medication refill request: OCP Last AEX:  12-21-16  Next AEX: 12-22-17  Last MMG (if hormonal medication request): none on file Refill authorized: please advise

## 2017-12-11 NOTE — Telephone Encounter (Signed)
I can do 90 day refill after her annual exam is done.

## 2017-12-22 ENCOUNTER — Telehealth: Payer: Self-pay | Admitting: Obstetrics and Gynecology

## 2017-12-22 ENCOUNTER — Ambulatory Visit: Payer: 59 | Admitting: Obstetrics and Gynecology

## 2017-12-27 ENCOUNTER — Telehealth: Payer: Self-pay | Admitting: Obstetrics and Gynecology

## 2017-12-27 ENCOUNTER — Ambulatory Visit: Payer: 59 | Admitting: Obstetrics and Gynecology

## 2017-12-27 NOTE — Telephone Encounter (Signed)
Patient cancelled and rescheduled appointment today because daughter is sick. Rescheduled to 01/08/18 at 8:30.

## 2017-12-27 NOTE — Telephone Encounter (Signed)
Thank you for the update.  Encounter closed. 

## 2018-01-08 ENCOUNTER — Ambulatory Visit (INDEPENDENT_AMBULATORY_CARE_PROVIDER_SITE_OTHER): Payer: 59 | Admitting: Obstetrics and Gynecology

## 2018-01-08 ENCOUNTER — Encounter: Payer: Self-pay | Admitting: Obstetrics and Gynecology

## 2018-01-08 ENCOUNTER — Other Ambulatory Visit: Payer: Self-pay

## 2018-01-08 ENCOUNTER — Other Ambulatory Visit: Payer: Self-pay | Admitting: Obstetrics and Gynecology

## 2018-01-08 VITALS — BP 110/68 | HR 74 | Resp 14 | Ht 66.25 in | Wt 177.2 lb

## 2018-01-08 DIAGNOSIS — Z01419 Encounter for gynecological examination (general) (routine) without abnormal findings: Secondary | ICD-10-CM

## 2018-01-08 DIAGNOSIS — Z1231 Encounter for screening mammogram for malignant neoplasm of breast: Secondary | ICD-10-CM

## 2018-01-08 MED ORDER — NORETHINDRONE 0.35 MG PO TABS: ORAL_TABLET | ORAL | 3 refills | Status: DC

## 2018-01-08 NOTE — Progress Notes (Signed)
42 y.o. G84P2012 Married Caucasian female here for annual exam.    Had melanoma surgery for stage 0 melanoma.   Happy with POPs.  Controlling her cycles adequately.   Labs with Endocrinology.   PCP:   No PCP per patient Endocrinology:  Dr. Forde Dandy.   Patient's last menstrual period was 12/13/2017.     Period Cycle (Days): 28 Period Duration (Days): 4-6 Period Pattern: Regular Menstrual Flow: Moderate, Heavy Menstrual Control: Maxi pad, Tampon Menstrual Control Change Freq (Hours): every 2 hours Dysmenorrhea: (!) Moderate Dysmenorrhea Symptoms: Cramping, Other (Comment)(back pain)     Sexually active: Yes.    The current method of family planning is Nora-Be.    Exercising: Yes.    swimming, walking, dancing Smoker:  no  Health Maintenance: Pap:  12/21/16 Pap and HR HPV negative History of abnormal Pap:  no MMG:  Needs to call and schedule TDaP:  ?2009  Gardasil:   no HIV: negative in pregnancy Screening Labs: done recently by Dr. Forde Dandy  Hb today: same, Urine today: not collected   reports that she quit smoking about 13 months ago. Her smoking use included cigarettes. She has never used smokeless tobacco. She reports that she does not drink alcohol or use drugs.  Past Medical History:  Diagnosis Date  . Dysmenorrhea   . Infertility, female 2008  . Melanoma ([place]) 2019  . PCOS (polycystic ovarian syndrome) 2008  . Thyroid disease    HYPOTHYOIDISM    Past Surgical History:  Procedure Laterality Date  . CESAREAN SECTION  11/26/09  . VEIN SURGERY Right 2012   states she had clot in groin following this.    Current Outpatient Medications  Medication Sig Dispense Refill  . levothyroxine (SYNTHROID, LEVOTHROID) 137 MCG tablet Take 137 mcg by mouth daily before breakfast.    . NORLYDA 0.35 MG tablet TAKE 1 TABLET(0.35 MG) BY MOUTH DAILY 28 tablet 0  . phentermine (ADIPEX-P) 37.5 MG tablet TK 1 T PO ONCE D  3   No current facility-administered medications for this visit.      Family History  Problem Relation Age of Onset  . Thyroid disease Mother        HYPO  . Thyroid disease Father   . Graves' disease Father   . Cancer Father 27       Kidney cancer  . Diabetes Daughter     Review of Systems  Constitutional: Negative.   HENT: Negative.   Eyes: Negative.   Respiratory: Negative.   Cardiovascular: Negative.   Gastrointestinal: Negative.   Endocrine: Negative.   Genitourinary: Negative.   Musculoskeletal: Negative.   Skin: Negative.   Allergic/Immunologic: Negative.   Neurological: Negative.   Hematological: Negative.   Psychiatric/Behavioral: Negative.     Exam:   BP 110/68 (BP Location: Right Arm, Patient Position: Sitting, Cuff Size: Normal)   Pulse 74   Resp 14   Ht 5' 6.25" (1.683 m)   Wt 177 lb 4 oz (80.4 kg)   LMP 12/13/2017   BMI 28.39 kg/m     General appearance: alert, cooperative and appears stated age Head: Normocephalic, without obvious abnormality, atraumatic Neck: no adenopathy, supple, symmetrical, trachea midline and thyroid normal to inspection and palpation Lungs: clear to auscultation bilaterally Breasts: normal appearance, no masses or tenderness, No nipple retraction or dimpling, No nipple discharge or bleeding, No axillary or supraclavicular adenopathy Heart: regular rate and rhythm Abdomen: soft, non-tender; no masses, no organomegaly Extremities: extremities normal, atraumatic, no cyanosis or edema Skin: Skin  color, texture, turgor normal. No rashes or lesions Lymph nodes: Cervical, supraclavicular, and axillary nodes normal. No abnormal inguinal nodes palpated Neurologic: Grossly normal  Pelvic: External genitalia:  no lesions              Urethra:  normal appearing urethra with no masses, tenderness or lesions              Bartholins and Skenes: normal                 Vagina: normal appearing vagina with normal color and discharge, no lesions              Cervix: no lesions              Pap taken:  No. Bimanual Exam:  Uterus:  normal size, contour, position, consistency, mobility, non-tender              Adnexa: no mass, fullness, tenderness              Rectal exam: No..     Chaperone was present for exam.  Assessment:   Well woman visit with normal exam. Dysmenorrhea and menorrhagia.  On POP.  Anaphylaxis with Advil and ASA.  Hx C/S.  Goiter.  Potential hx of thromboembolic event.  Recent melanoma.  Plan: Mammogram screening.  We will schedule.  Pap and HR HPV as above. Guidelines for Calcium, Vitamin D, regular exercise program including cardiovascular and weight bearing exercise. Refill of POPs for one year.  Declines TDap vaccine.  Follow up annually and prn.   After visit summary provided.

## 2018-01-08 NOTE — Progress Notes (Signed)
Patient scheduled while in office for screening mammogram at [facility] on 01/30/18 arriving at 11:50am for 12:10pm appt. Patient verbalizes understanding and is agreeable to date and time.

## 2018-01-08 NOTE — Patient Instructions (Signed)

## 2018-01-10 ENCOUNTER — Other Ambulatory Visit: Payer: Self-pay | Admitting: Obstetrics and Gynecology

## 2018-01-30 ENCOUNTER — Ambulatory Visit: Payer: Self-pay

## 2018-02-15 DIAGNOSIS — Z86008 Personal history of in-situ neoplasm of other site: Secondary | ICD-10-CM | POA: Diagnosis not present

## 2018-02-15 DIAGNOSIS — D2371 Other benign neoplasm of skin of right lower limb, including hip: Secondary | ICD-10-CM | POA: Diagnosis not present

## 2018-02-15 DIAGNOSIS — D224 Melanocytic nevi of scalp and neck: Secondary | ICD-10-CM | POA: Diagnosis not present

## 2018-05-03 ENCOUNTER — Ambulatory Visit
Admission: RE | Admit: 2018-05-03 | Discharge: 2018-05-03 | Disposition: A | Discharge status: Home / Self Care | Payer: Self-pay | Source: Ambulatory Visit | Attending: Obstetrics and Gynecology | Admitting: Obstetrics and Gynecology

## 2018-05-03 DIAGNOSIS — Z1231 Encounter for screening mammogram for malignant neoplasm of breast: Secondary | ICD-10-CM | POA: Diagnosis not present

## 2018-05-31 DIAGNOSIS — Z86008 Personal history of in-situ neoplasm of other site: Secondary | ICD-10-CM | POA: Diagnosis not present

## 2018-05-31 DIAGNOSIS — D224 Melanocytic nevi of scalp and neck: Secondary | ICD-10-CM | POA: Diagnosis not present

## 2018-05-31 DIAGNOSIS — Z86018 Personal history of other benign neoplasm: Secondary | ICD-10-CM | POA: Diagnosis not present

## 2018-06-07 DIAGNOSIS — J019 Acute sinusitis, unspecified: Secondary | ICD-10-CM | POA: Diagnosis not present

## 2018-07-03 DIAGNOSIS — J209 Acute bronchitis, unspecified: Secondary | ICD-10-CM | POA: Diagnosis not present

## 2018-07-23 DIAGNOSIS — E038 Other specified hypothyroidism: Secondary | ICD-10-CM | POA: Diagnosis not present

## 2018-07-23 DIAGNOSIS — E048 Other specified nontoxic goiter: Secondary | ICD-10-CM | POA: Diagnosis not present

## 2018-08-06 DIAGNOSIS — J209 Acute bronchitis, unspecified: Secondary | ICD-10-CM | POA: Diagnosis not present

## 2018-10-02 DIAGNOSIS — L820 Inflamed seborrheic keratosis: Secondary | ICD-10-CM | POA: Diagnosis not present

## 2018-12-14 ENCOUNTER — Other Ambulatory Visit: Payer: Self-pay | Admitting: Obstetrics and Gynecology

## 2018-12-14 NOTE — Telephone Encounter (Signed)
Medication refill request: Norlyda Last AEX:  01/08/18 BS Next AEX: 02/08/19 Last MMG (if hormonal medication request): 05/02/18 BIRADS 1 negative/density c Refill authorized: Please advise; order pended for #3 w/0 refills to get patient to AEX

## 2019-02-08 ENCOUNTER — Ambulatory Visit: Payer: 59 | Admitting: Obstetrics and Gynecology

## 2019-02-15 ENCOUNTER — Encounter: Payer: Self-pay | Admitting: Obstetrics and Gynecology

## 2019-02-15 ENCOUNTER — Other Ambulatory Visit: Payer: Self-pay

## 2019-02-15 ENCOUNTER — Ambulatory Visit: Payer: 59 | Admitting: Obstetrics and Gynecology

## 2019-02-15 VITALS — BP 112/62 | HR 76 | Temp 98.0°F | Resp 14 | Ht 66.0 in | Wt 185.0 lb

## 2019-02-15 DIAGNOSIS — Z01419 Encounter for gynecological examination (general) (routine) without abnormal findings: Secondary | ICD-10-CM | POA: Diagnosis not present

## 2019-02-15 MED ORDER — NORETHINDRONE 0.35 MG PO TABS: ORAL_TABLET | ORAL | 3 refills | Status: DC

## 2019-02-15 NOTE — Patient Instructions (Signed)

## 2019-02-15 NOTE — Progress Notes (Signed)
43 y.o. G21P2012 Married Caucasian female here for annual exam.    Periods are ok.  Would like to continue Micronor.   Has hemorrhoids from time to time.  No current problems.   Not working full time right now.   Daughter is now 21 and has insulin dependent diabetes.  She was dx 6 years ago.  Patient states she is not sleeping due to caring for her daughter. Patient has a good support group for diabetes.   Labs with Dr. Forde Dandy.   PCP: No PCP     Patient's last menstrual period was 01/29/2019.           Sexually active: Yes.    The current method of family planning is [place].    Exercising: Yes.    walking, bike riding, swimming Smoker:  no  Health Maintenance: Pap:  12/21/16 Pap and HR HPV negative History of abnormal Pap:  no MMG:  05/03/18 BIRADS 1 negative/density c TDaP: UTD per patient Gardasil:   no HIV: negative in pregnancy Screening Labs: Dr. Forde Dandy   reports that she quit smoking about 2 years ago. Her smoking use included cigarettes. She has never used smokeless tobacco. She reports that she does not drink alcohol or use drugs.  Past Medical History:  Diagnosis Date  . Dysmenorrhea   . Infertility, female 2008  . Melanoma ([place]) 2019  . PCOS (polycystic ovarian syndrome) 2008  . Thyroid disease    HYPOTHYOIDISM    Past Surgical History:  Procedure Laterality Date  . CESAREAN SECTION  11/26/09  . VEIN SURGERY Right 2012   states she had clot in groin following this.    Current Outpatient Medications  Medication Sig Dispense Refill  . levothyroxine (SYNTHROID, LEVOTHROID) 137 MCG tablet Take 137 mcg by mouth daily before breakfast.    . NORLYDA 0.35 MG tablet TAKE 1 TABLET(0.35 MG) BY MOUTH DAILY 84 tablet 0   No current facility-administered medications for this visit.     Family History  Problem Relation Age of Onset  . Thyroid disease Mother        HYPO  . Thyroid disease Father   . Graves' disease Father   . Cancer Father 66       Kidney  cancer  . Diabetes Daughter     Review of Systems  Constitutional: Negative.   HENT: Negative.   Eyes: Negative.   Respiratory: Negative.   Cardiovascular: Negative.   Gastrointestinal: Negative.   Endocrine: Negative.   Genitourinary: Negative.   Musculoskeletal: Negative.   Skin: Negative.   Allergic/Immunologic: Negative.   Neurological: Negative.   Hematological: Negative.   Psychiatric/Behavioral: Negative.     Exam:   BP 112/62 (BP Location: Right Arm, Patient Position: Sitting, Cuff Size: Large)   Pulse 76   Temp 98 F (36.7 C) (Temporal)   Resp 14   Ht 5\' 6"  (1.676 m)   Wt 185 lb (83.9 kg)   LMP 01/29/2019   BMI 29.86 kg/m     General appearance: alert, cooperative and appears stated age Head: Normocephalic, without obvious abnormality, atraumatic Neck: no adenopathy, supple, symmetrical, trachea midline and thyroid enlarged and nontender.  Lungs: clear to auscultation bilaterally Breasts: normal appearance, no masses or tenderness, No nipple retraction or dimpling, No nipple discharge or bleeding, No axillary or supraclavicular adenopathy Heart: regular rate and rhythm Abdomen: soft, non-tender; no masses, no organomegaly Extremities: extremities normal, atraumatic, no cyanosis or edema Skin: Skin color, texture, turgor normal. No rashes or lesions Lymph  nodes: Cervical, supraclavicular, and axillary nodes normal. No abnormal inguinal nodes palpated Neurologic: Grossly normal  Pelvic: External genitalia:  no lesions              Urethra:  normal appearing urethra with no masses, tenderness or lesions              Bartholins and Skenes: normal                 Vagina: normal appearing vagina with normal color and discharge, no lesions              Cervix: no lesions              Pap taken: No. Bimanual Exam:  Uterus:  normal size, contour, position, consistency, mobility, non-tender              Adnexa: no mass, fullness, tenderness              Rectal  exam:  Declines.   Chaperone was present for exam.  Assessment:   Well woman visit with normal exam. On POP.  Anaphylaxis with Advil and ASA.  Hx C/S.  Goiter.  Potential hx of thromboembolic event.  Hx melanoma. Situational stress.   Plan: Mammogram screening. Recommended self breast awareness. Pap and HR HPV as above. Guidelines for Calcium, Vitamin D, regular exercise program including cardiovascular and weight bearing exercise. Discussed Gardasil.  She may consider for next time.  Information given for counselors at [employer].  Follow up annually and prn.   After visit summary provided.

## 2020-01-05 IMAGING — MG DIGITAL SCREENING BILATERAL MAMMOGRAM WITH TOMO AND CAD
8 series · 8 of 24 positions shown · non-contrast
Comparison: None.

CLINICAL DATA: Screening.

EXAM:
DIGITAL SCREENING BILATERAL MAMMOGRAM WITH TOMO AND CAD

[R CC synth-2D]
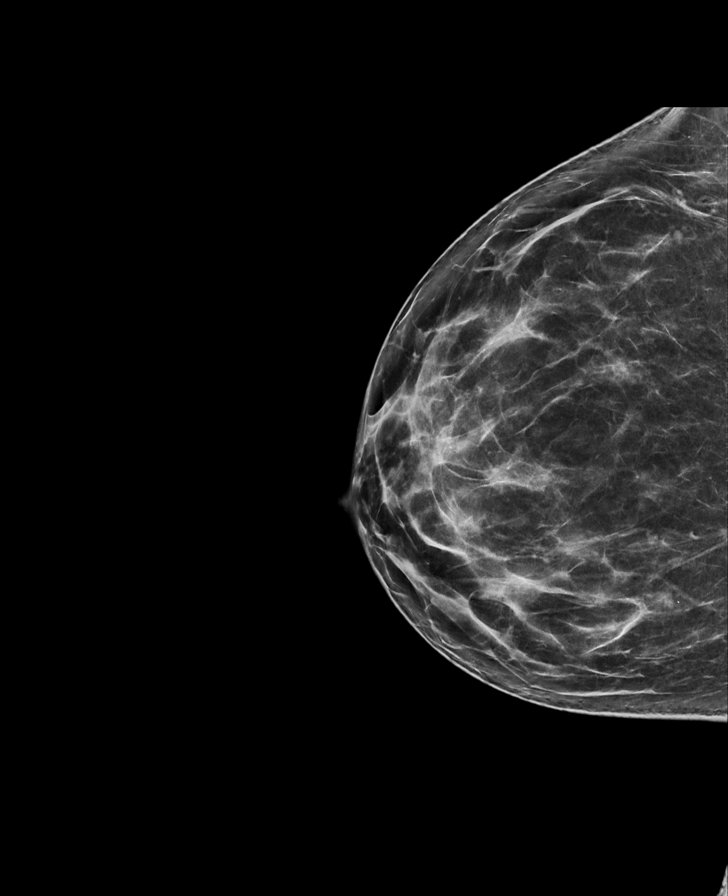

[L CC synth-2D]
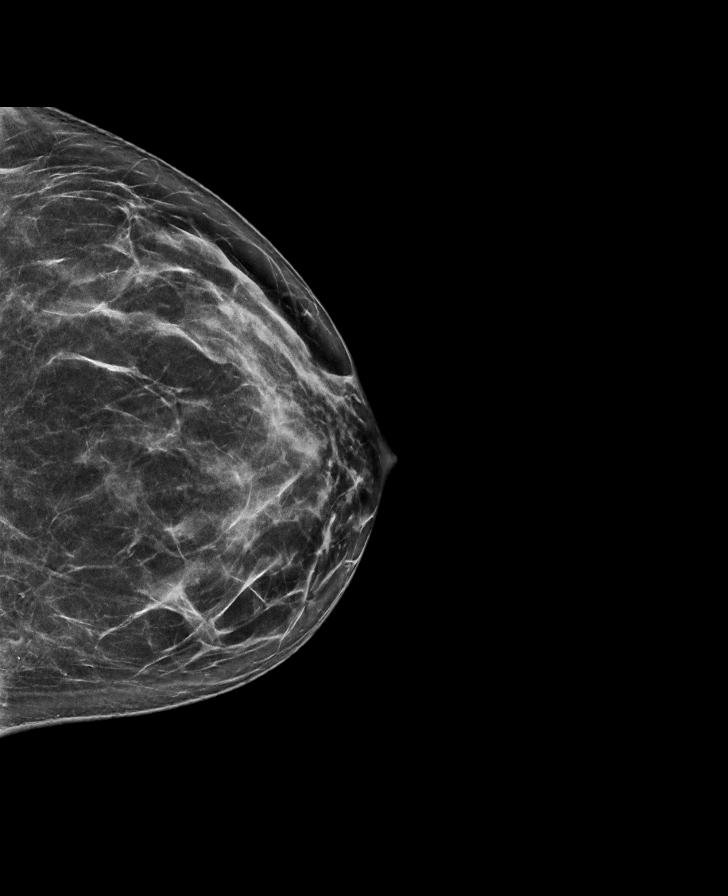

[L MLO synth-2D]
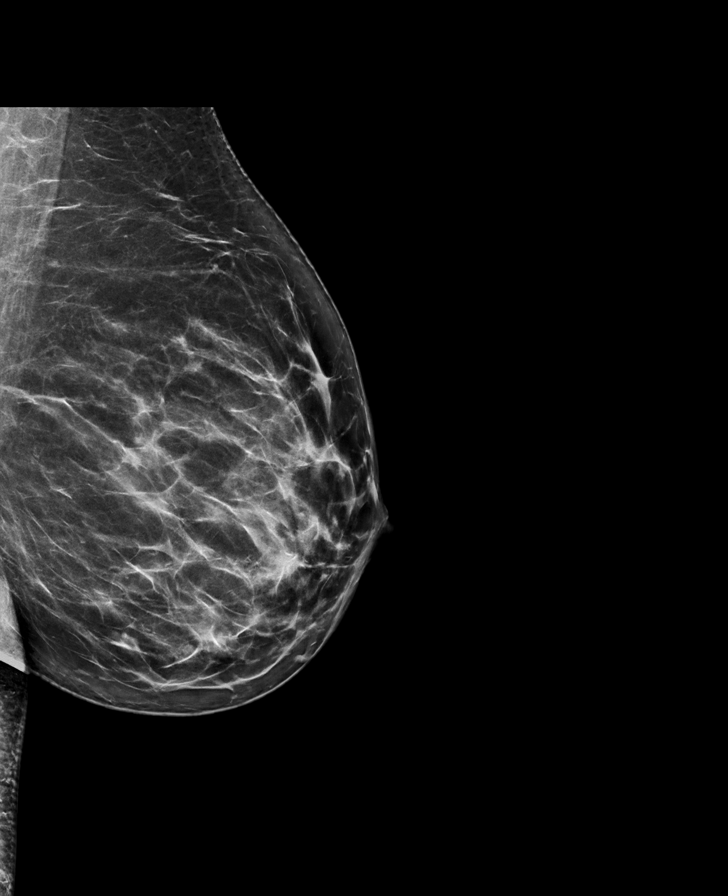

[R MLO synth-2D]
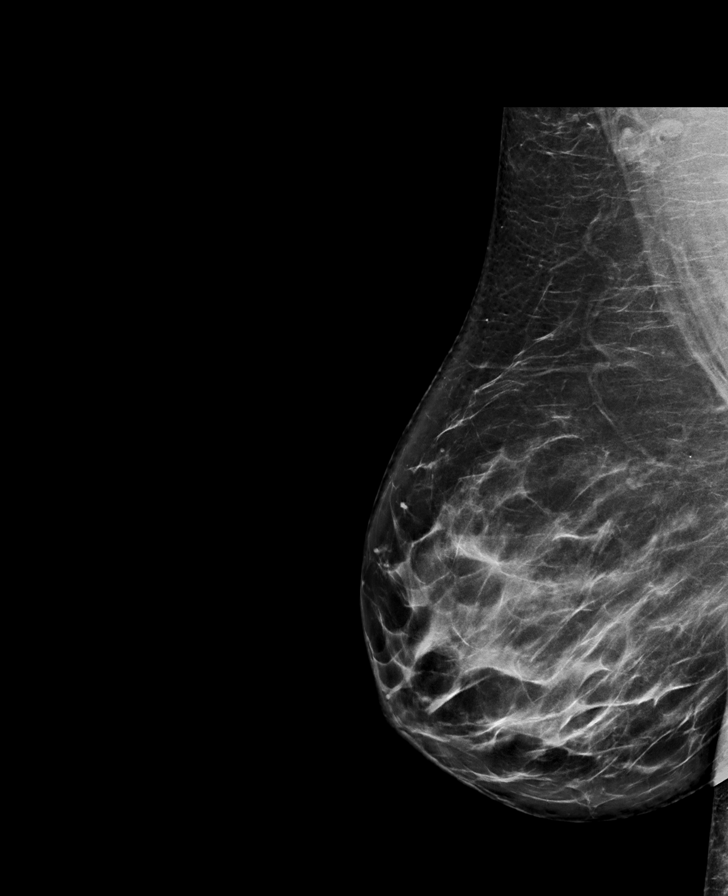

[R MLO tomo · tomo slice 48/95.0]
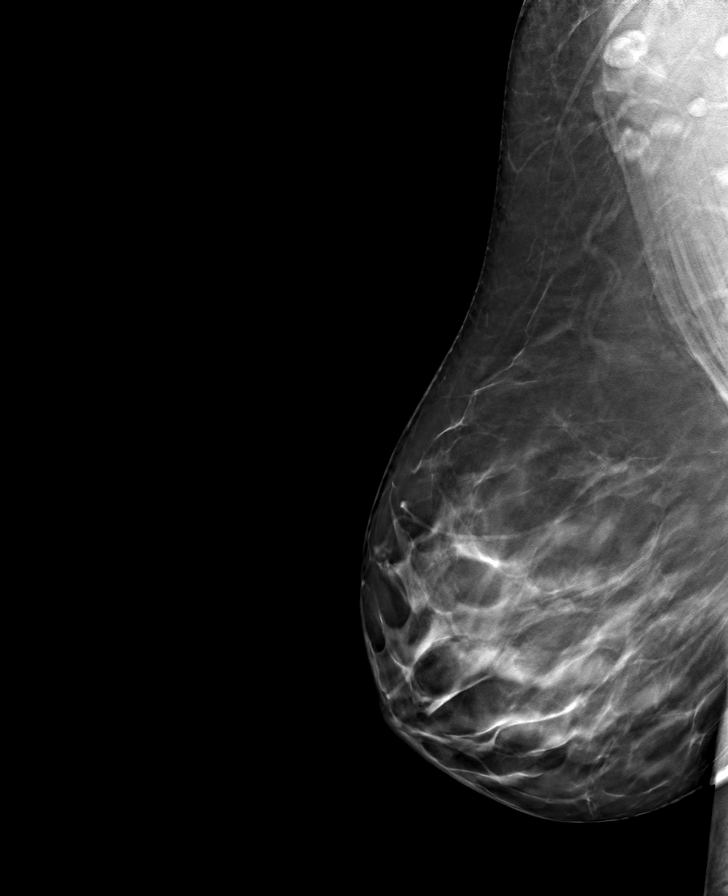

[L MLO tomo · tomo slice 43/85.0]
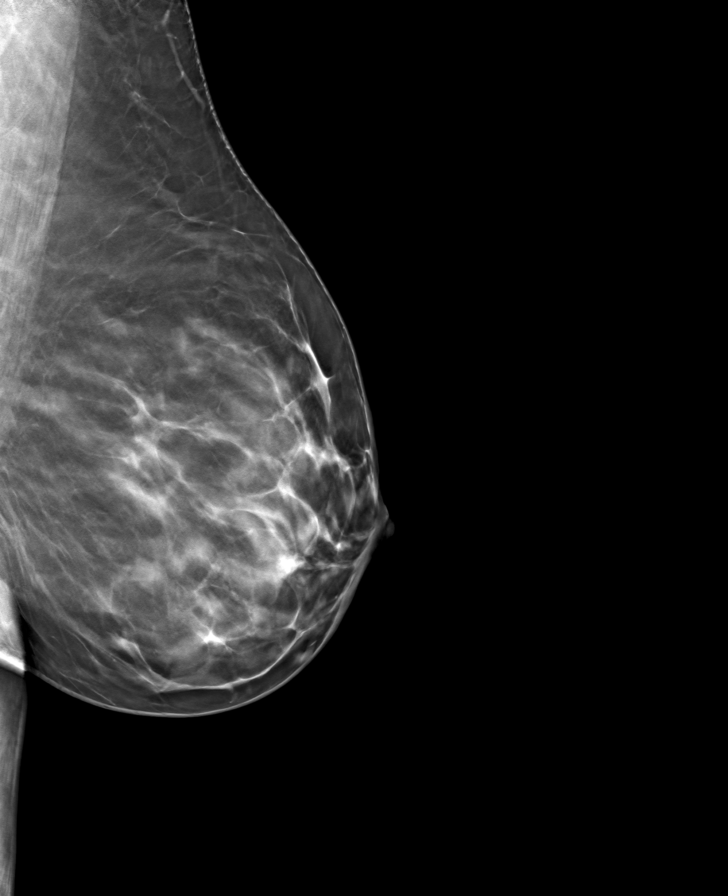

[L CC tomo · tomo slice 39/77.0]
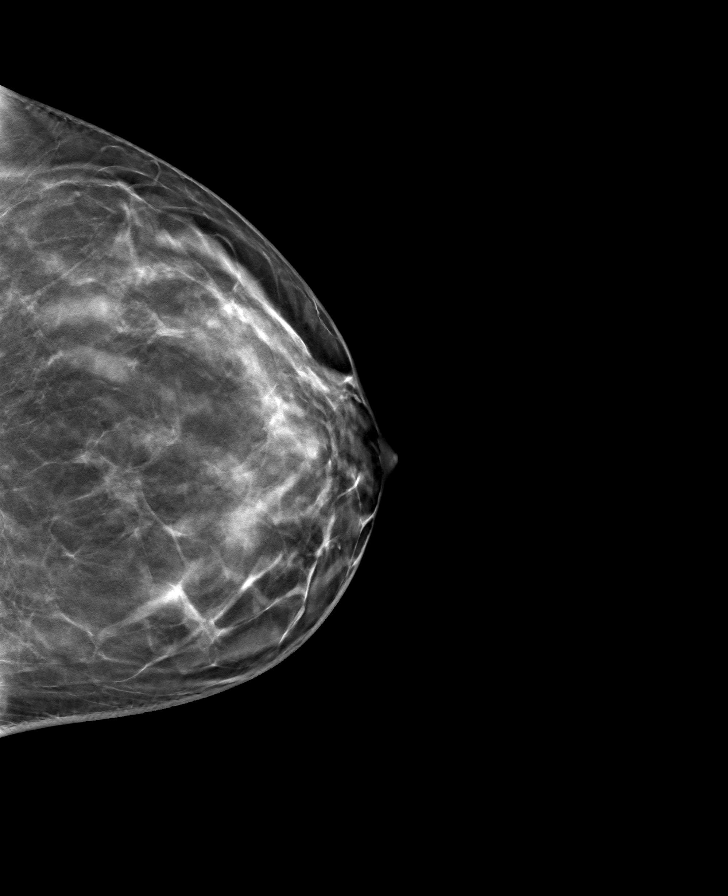

[R CC tomo · tomo slice 39/78.0]
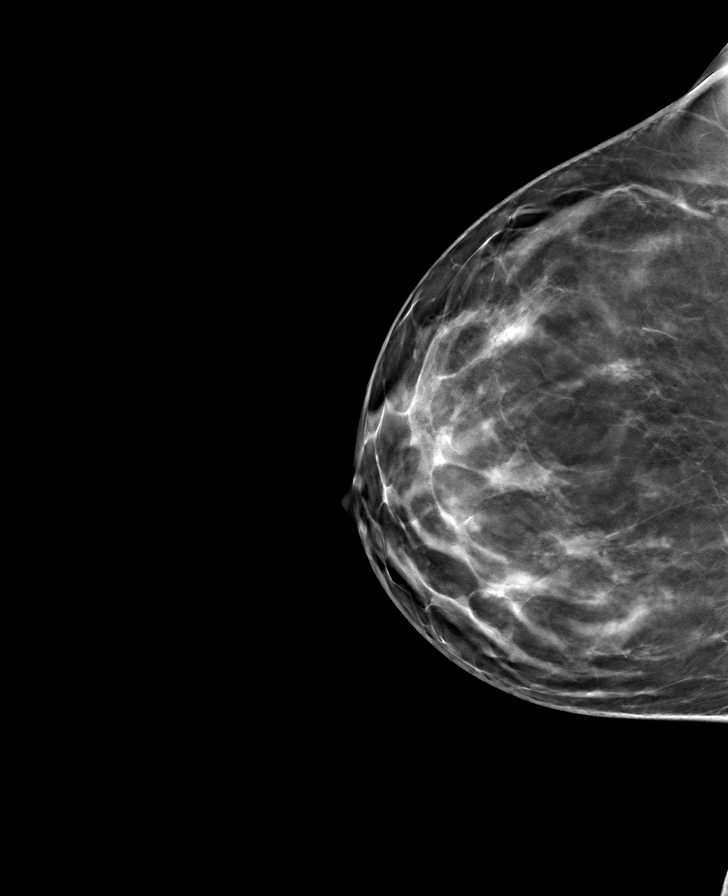

[8 of 24 positions shown; findings below may reference images not displayed]

ACR Breast Density Category c: The breast tissue is heterogeneously
dense, which may obscure small masses
FINDINGS: There are no findings suspicious for malignancy. Images were
processed with CAD.
IMPRESSION: No mammographic evidence of malignancy. A result letter of this
screening mammogram will be mailed directly to the patient.

RECOMMENDATION:
Screening mammogram in one year. (Code:EM-2-IHY)

BI-RADS CATEGORY  1: Negative.

## 2020-02-19 ENCOUNTER — Ambulatory Visit: Payer: 59 | Admitting: Obstetrics and Gynecology

## 2020-02-19 ENCOUNTER — Other Ambulatory Visit: Payer: Self-pay

## 2020-02-19 ENCOUNTER — Encounter: Payer: Self-pay | Admitting: Obstetrics and Gynecology

## 2020-02-19 VITALS — BP 122/66 | HR 80 | Temp 97.6°F | Resp 18 | Ht 66.0 in | Wt 182.0 lb

## 2020-02-19 DIAGNOSIS — Z01419 Encounter for gynecological examination (general) (routine) without abnormal findings: Secondary | ICD-10-CM

## 2020-02-19 MED ORDER — NORETHINDRONE 0.35 MG PO TABS: ORAL_TABLET | ORAL | 3 refills | Status: DC

## 2020-02-19 NOTE — Progress Notes (Signed)
44 y.o. G74P2012 Married Caucasian female here for annual exam.    Patient states had very heavy cycle in March. Resolved in 24 hours.   Taking Micronor.   Not hot flashes.   Loss of urine with climax a couple of times.   Had her second Covid vaccine in mid April.   PCP:   Dr. Forde Dandy  Patient's last menstrual period was 02/05/2020 (approximate).           Sexually active: Yes.    The current method of family planning is s.    Exercising: Yes.    spinning, walking and cleaning Smoker:  no  Health Maintenance:  Pap: 12-21-16 Neg:Neg HR HPV, 11-12-13 Neg:Neg HR HPV History of abnormal Pap:  no MMG: 05-03-18 3D/Neg/density C/BiRads1 Colonoscopy:  NEVER BMD:   n/a  Result  n/a TDaP:  Unsure Gardasil:   no HIV:Neg in pregnancy Hep C:no Screening Labs:  PCP.   reports that she quit smoking about 3 years ago. Her smoking use included cigarettes. She has never used smokeless tobacco. She reports that she does not drink alcohol or use drugs.  Past Medical History:  Diagnosis Date  . Dysmenorrhea   . Infertility, female 2008  . Melanoma ([place]) 2019  . PCOS (polycystic ovarian syndrome) 2008  . Thyroid disease    HYPOTHYOIDISM    Past Surgical History:  Procedure Laterality Date  . CESAREAN SECTION  11/26/09  . VEIN SURGERY Right 2012   states she had clot in groin following this.    Current Outpatient Medications  Medication Sig Dispense Refill  . levothyroxine (SYNTHROID) 150 MCG tablet Take 150 mcg by mouth daily.    . norethindrone (NORLYDA) 0.35 MG tablet TAKE 1 TABLET(0.35 MG) BY MOUTH DAILY 84 tablet 3   No current facility-administered medications for this visit.    Family History  Problem Relation Age of Onset  . Thyroid disease Mother        HYPO  . Thyroid disease Father   . Graves' disease Father   . Cancer Father 1       Kidney cancer  . Diabetes Daughter     Review of Systems  All other systems reviewed and are negative.   Exam:   BP 122/66  (Cuff Size: Large)   Pulse 80   Temp 97.6 F (36.4 C) (Temporal)   Resp 18   Ht 5\' 6"  (1.676 m)   Wt 182 lb (82.6 kg)   LMP 02/05/2020 (Approximate)   BMI 29.38 kg/m     General appearance: alert, cooperative and appears stated age Head: normocephalic, without obvious abnormality, atraumatic Neck: no adenopathy, supple, symmetrical, trachea midline and thyroid normal to inspection and palpation Lungs: clear to auscultation bilaterally Breasts: normal appearance, no masses or tenderness, No nipple retraction or dimpling, No nipple discharge or bleeding, No axillary adenopathy Heart: regular rate and rhythm Abdomen: soft, non-tender; no masses, no organomegaly Extremities: extremities normal, atraumatic, no cyanosis or edema Skin: skin color, texture, turgor normal. No rashes or lesions Lymph nodes: cervical, supraclavicular, and axillary nodes normal. Neurologic: grossly normal  Pelvic: External genitalia:  no lesions              No abnormal inguinal nodes palpated.              Urethra:  normal appearing urethra with no masses, tenderness or lesions              Bartholins and Skenes: normal  Vagina: normal appearing vagina with normal color and discharge, no lesions              Cervix: no lesions              Pap taken: No. Bimanual Exam:  Uterus:  normal size, contour, position, consistency, mobility, non-tender              Adnexa: no mass, fullness, tenderness              Rectal exam: Declned.   Chaperone was present for exam.  Assessment:   Well woman visit with normal exam. On POP.  Anaphylaxis with Advil and ASA.  Hx C/S.  Goiter.  Potential hx of thromboembolic event.  Hx melanoma. Urinary incontinence with orgasm.  Plan: Mammogram screening discussed.  She will schedule.  Self breast awareness reviewed. Pap and HR HPV as above. Guidelines for Calcium, Vitamin D, regular exercise program including cardiovascular and weight bearing  exercise. Refill of Micronor 3 months and 3 refills.  She will monitor urinary incontinence and call it it becomes more generalized.  Labs with PCP. Follow up annually and prn.   After visit summary provided.

## 2020-02-19 NOTE — Patient Instructions (Signed)

## 2020-05-01 ENCOUNTER — Other Ambulatory Visit: Payer: Self-pay | Admitting: Obstetrics and Gynecology

## 2021-02-08 NOTE — Progress Notes (Signed)
45 y.o. G82P2012 Married Caucasian female here for annual exam.  Would like to discuss getting the HPV vaccine.  Menses are regular.  Husband just had vasectomy.  Patient is looking forward to stopping Micronor.  Has lost about 25 pounds and gained 10 back on Octavia.   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  PCP: Dr. Forde Dandy Endocrinologist   Last menstrual period was 02/16/2021 (exact date).     Period Cycle (Days): 30 Period Duration (Days): 6 Period Pattern: Regular Menstrual Flow: Heavy Menstrual Control: Maxi pad,Tampon Dysmenorrhea: (!) Moderate Dysmenorrhea Symptoms: Cramping,Headache     Sexually active: Yes. The current method of family planning is Micronor and patient's husband with vasectomy.  Exercising: Yes. Spinning, walking/swimming Smoker: current some day smoker.   Health Maintenance: Pap:  12-21-16 Neg:Neg HR HPV, 11-12-13 Neg:Neg HR HPV History of abnormal Pap:  No MMG:   05-03-18 3D/Neg/density C/BiRads1 Colonoscopy: Never TDaP: 10/2020 HIV: Neg in pregnancy Hep C: No. Screening Labs: PCP   reports that she has been smoking cigarettes. She has never used smokeless tobacco. She reports that she does not drink alcohol and does not use drugs.  Past Medical History:  Diagnosis Date  . Dysmenorrhea   . Infertility, female 2008  . Melanoma ([place]) 2019  . PCOS (polycystic ovarian syndrome) 2008  . Rosacea   . Thyroid disease    HYPOTHYOIDISM    Past Surgical History:  Procedure Laterality Date  . CESAREAN SECTION  11/26/09  . VEIN SURGERY Right 2012   states she had clot in groin following this.    Current Outpatient Medications  Medication Sig Dispense Refill  . levothyroxine (SYNTHROID) 150 MCG tablet Take 150 mcg by mouth daily.    . Melatonin 10 MG CAPS Take by mouth.    . norethindrone (NORLYDA) 0.35 MG tablet TAKE 1 TABLET(0.35 MG) BY MOUTH DAILY 28 tablet 0   No current facility-administered medications for this visit.    Family History  Problem  Relation Age of Onset  . Thyroid disease Mother        HYPO  . Thyroid disease Father   . Graves' disease Father   . Cancer Father 97       Kidney cancer  . Diabetes Daughter     Review of Systems  Constitutional: Negative.   HENT: Negative.   Eyes: Negative.   Respiratory: Negative.   Cardiovascular: Negative.   Gastrointestinal: Negative.   Endocrine: Negative.   Genitourinary: Negative.   Musculoskeletal: Negative.   Skin: Negative.   Allergic/Immunologic: Negative.   Neurological: Negative.   Hematological: Negative.   Psychiatric/Behavioral: Negative.     Exam:   BP 126/82 (BP Location: Right Arm, Patient Position: Sitting, Cuff Size: Normal)   Pulse 96   Ht 5\' 6"  (1.676 m)   Wt 171 lb (77.6 kg)   SpO2 99%   BMI 27.60 kg/m     General appearance: alert, cooperative and appears stated age Head: normocephalic, without obvious abnormality, atraumatic Neck: no adenopathy, supple, symmetrical, trachea midline and thyroid normal to inspection and palpation Lungs: clear to auscultation bilaterally Breasts: normal appearance, no masses or tenderness, No nipple retraction or dimpling, No nipple discharge or bleeding, No axillary adenopathy Heart: regular rate and rhythm Abdomen: soft, non-tender; no masses, no organomegaly Extremities: extremities normal, atraumatic, no cyanosis or edema Skin: skin color, texture, turgor normal. No rashes or lesions Lymph nodes: cervical, supraclavicular, and axillary nodes normal. Neurologic: grossly normal  Pelvic: External genitalia:  no lesions  No abnormal inguinal nodes palpated.              Urethra:  normal appearing urethra with no masses, tenderness or lesions              Bartholins and Skenes: normal                 Vagina: normal appearing vagina with normal color and discharge, no lesions              Cervix: no lesions.  Mild menstrual flow.               Pap taken: No. Bimanual Exam:  Uterus:  normal size,  contour, position, consistency, mobility, non-tender              Adnexa: no mass, fullness, tenderness              Rectal exam: declined Chaperone was present for exam.  Assessment:   Well woman visit with normal exam. On POP.  Anaphylaxis with Advil and ASA.  Hx C/S.  Goiter.  Potential hx of thromboembolic event. Hx melanoma. Quit tobacco.   Plan: Mammogram screening discussed.  She will schedule.  Self breast awareness reviewed. Pap and HR HPV as above. Guidelines for Calcium, Vitamin D, regular exercise program including cardiovascular and weight bearing exercise. Start HPV vaccine series.  Refill of Micronor x 1 month.  Needs to update mammogram. Congratulations on weight loss and smoking cessation! Follow up annually and prn.

## 2021-02-22 ENCOUNTER — Other Ambulatory Visit: Payer: Self-pay

## 2021-02-22 ENCOUNTER — Ambulatory Visit (INDEPENDENT_AMBULATORY_CARE_PROVIDER_SITE_OTHER): Payer: 59 | Admitting: Obstetrics and Gynecology

## 2021-02-22 ENCOUNTER — Encounter: Payer: Self-pay | Admitting: Obstetrics and Gynecology

## 2021-02-22 VITALS — BP 126/82 | HR 96 | Ht 66.0 in | Wt 171.0 lb

## 2021-02-22 DIAGNOSIS — Z23 Encounter for immunization: Secondary | ICD-10-CM | POA: Diagnosis not present

## 2021-02-22 DIAGNOSIS — Z01419 Encounter for gynecological examination (general) (routine) without abnormal findings: Secondary | ICD-10-CM | POA: Diagnosis not present

## 2021-02-22 MED ORDER — NORETHINDRONE 0.35 MG PO TABS: ORAL_TABLET | ORAL | 0 refills | Status: DC

## 2021-02-22 NOTE — Patient Instructions (Signed)

## 2021-04-26 ENCOUNTER — Other Ambulatory Visit: Payer: Self-pay

## 2021-04-26 ENCOUNTER — Ambulatory Visit (INDEPENDENT_AMBULATORY_CARE_PROVIDER_SITE_OTHER): Payer: 59 | Admitting: *Deleted

## 2021-04-26 VITALS — BP 120/80 | HR 78 | Resp 14 | Ht 67.0 in | Wt 169.0 lb

## 2021-04-26 DIAGNOSIS — Z23 Encounter for immunization: Secondary | ICD-10-CM

## 2021-04-26 NOTE — Progress Notes (Signed)
Patient in today for second Gardasil injection.   Contraception: OCP LMP: 04-05-21 Last AEX: 02-22-21 with Dr. Quincy Simmonds  Injection given in right deltoid. Patient tolerated shot well.   Patient informed next injection due in about four months.  Advised patient, if not on birth control, to return for next injection with cycle.   Routed to provider for final review.  Encounter closed.

## 2021-04-29 ENCOUNTER — Other Ambulatory Visit: Payer: Self-pay | Admitting: Obstetrics and Gynecology

## 2021-08-30 ENCOUNTER — Ambulatory Visit (INDEPENDENT_AMBULATORY_CARE_PROVIDER_SITE_OTHER): Payer: 59

## 2021-08-30 ENCOUNTER — Other Ambulatory Visit: Payer: Self-pay

## 2021-08-30 DIAGNOSIS — Z23 Encounter for immunization: Secondary | ICD-10-CM | POA: Diagnosis not present

## 2022-02-09 NOTE — Progress Notes (Addendum)
46 y.o. G56P2012 Married Caucasian female here for annual exam.    Working on weight loss.  Used Octavia in the past and did well.  Menses are heavy.   On Doxycycline daily for acne.  Sees [facility] [facility].  Is now a step grandmother.   PCP:  Dr. Roque Cash  Patient's last menstrual period was 02/02/2022.     Period Cycle (Days): 28 Period Duration (Days): 7 Period Pattern: Regular Menstrual Flow: Heavy Dysmenorrhea: (!) Severe Dysmenorrhea Symptoms: Cramping     Sexually active: Yes.    The current method of family planning is vasectomy.    Exercising: Yes.    Smoker:former  Health Maintenance: Pap:  12-21-16 normal Neg HPV,Pap 11-12-13 Normal Neg HPV History of abnormal Pap:  no MMG:  04-23-18 Normal Bi RADS 1.  She will schedule.  Colonoscopy:  N/A BMD:   N/A  Result  N/A TDaP:  2022 Gardasil:   yes HIV:NR Hep C:No Screening Labs:  Hb today: PCP, Urine today: PCP   reports that she quit smoking about 5 years ago. Her smoking use included cigarettes. She has never used smokeless tobacco. She reports current alcohol use of about 2.0 standard drinks per week. She reports that she does not use drugs.  Past Medical History:  Diagnosis Date   Dysmenorrhea    Infertility, female 2008   Melanoma ([place]) 2019   PCOS (polycystic ovarian syndrome) 2008   Rosacea    Thyroid disease    HYPOTHYOIDISM    Past Surgical History:  Procedure Laterality Date   CESAREAN SECTION  11/26/09   VEIN SURGERY Right 2012   states she had clot in groin following this.    Current Outpatient Medications  Medication Sig Dispense Refill   doxycycline (VIBRAMYCIN) 100 MG capsule Take 100 mg by mouth daily.     levothyroxine (SYNTHROID) 150 MCG tablet Take 150 mcg by mouth daily.     Melatonin 10 MG CAPS Take by mouth.     NURTEC 75 MG TBDP SMARTSIG:1 Tablet(s) By Mouth     No current facility-administered medications for this visit.    Family History  Problem Relation Age of Onset    Thyroid disease Mother        HYPO   Thyroid disease Father    Graves' disease Father    Cancer Father 73       Kidney cancer   Diabetes Daughter     Review of Systems  All other systems reviewed and are negative.  Exam:   BP 118/78 (BP Location: Left Arm, Patient Position: Sitting, Cuff Size: Normal)   Pulse 82   Ht 5' 5.5" (1.664 m)   Wt 188 lb (85.3 kg)   LMP 02/02/2022   SpO2 99%   BMI 30.81 kg/m     General appearance: alert, cooperative and appears stated age Head: normocephalic, without obvious abnormality, atraumatic Neck: no adenopathy, supple, symmetrical, trachea midline and thyroid normal to inspection and palpation Lungs: clear to auscultation bilaterally Breasts: normal appearance, no masses or tenderness, No nipple retraction or dimpling, No nipple discharge or bleeding, No axillary adenopathy Heart: regular rate and rhythm Abdomen: soft, non-tender; no masses, no organomegaly Extremities: extremities normal, atraumatic, no cyanosis or edema Skin: skin color, texture, turgor normal. No rashes or lesions Lymph nodes: cervical, supraclavicular, and axillary nodes normal. Neurologic: grossly normal  Pelvic: External genitalia:  no lesions              No abnormal inguinal nodes palpated.  Urethra:  normal appearing urethra with no masses, tenderness or lesions              Bartholins and Skenes: normal                 Vagina: normal appearing vagina with normal color and discharge, no lesions              Cervix: no lesions              Pap taken: yes. Bimanual Exam:  Uterus:  normal size, contour, position, consistency, mobility, non-tender              Adnexa: no mass, fullness, tenderness              Rectal exam: declined.   Chaperone was present for exam:  Maudie Mercury, CMA  Assessment:   Well woman visit with gynecologic exam. Off POP.  Anaphylaxis with Advil and ASA.  Hx C/S.  Goiter.  Potential hx of thromboembolic event.  Hx melanoma. Quit  tobacco.   Plan: Mammogram screening discussed.  She will schedule.  Self breast awareness reviewed. Pap and HR HPV as above. Guidelines for Calcium, Vitamin D, regular exercise program including cardiovascular and weight bearing exercise.   Follow up annually and prn.   After visit summary provided.

## 2022-02-23 ENCOUNTER — Other Ambulatory Visit (HOSPITAL_COMMUNITY)
Admission: RE | Admit: 2022-02-23 | Discharge: 2022-02-23 | Disposition: A | Discharge status: Home / Self Care | Payer: 59 | Source: Ambulatory Visit | Attending: Obstetrics and Gynecology | Admitting: Obstetrics and Gynecology

## 2022-02-23 ENCOUNTER — Encounter: Payer: Self-pay | Admitting: Obstetrics and Gynecology

## 2022-02-23 ENCOUNTER — Ambulatory Visit (INDEPENDENT_AMBULATORY_CARE_PROVIDER_SITE_OTHER): Payer: 59 | Admitting: Obstetrics and Gynecology

## 2022-02-23 VITALS — BP 118/78 | HR 82 | Ht 65.5 in | Wt 188.0 lb

## 2022-02-23 DIAGNOSIS — Z124 Encounter for screening for malignant neoplasm of cervix: Secondary | ICD-10-CM | POA: Diagnosis present

## 2022-02-23 DIAGNOSIS — Z01419 Encounter for gynecological examination (general) (routine) without abnormal findings: Secondary | ICD-10-CM

## 2022-02-23 NOTE — Patient Instructions (Signed)

## 2022-02-24 LAB — CYTOLOGY - PAP
Adequacy: ABSENT
Comment: NEGATIVE
Diagnosis: NEGATIVE
High risk HPV: NEGATIVE

## 2022-03-13 ENCOUNTER — Encounter (HOSPITAL_BASED_OUTPATIENT_CLINIC_OR_DEPARTMENT_OTHER): Payer: Self-pay | Admitting: Emergency Medicine

## 2022-03-13 ENCOUNTER — Emergency Department (HOSPITAL_BASED_OUTPATIENT_CLINIC_OR_DEPARTMENT_OTHER): Payer: 59

## 2022-03-13 ENCOUNTER — Emergency Department (HOSPITAL_BASED_OUTPATIENT_CLINIC_OR_DEPARTMENT_OTHER)
Admission: EM | Admit: 2022-03-13 | Discharge: 2022-03-13 | Disposition: A | Discharge status: Home / Self Care | Payer: 59 | Attending: Student | Admitting: Student

## 2022-03-13 ENCOUNTER — Other Ambulatory Visit: Payer: Self-pay

## 2022-03-13 DIAGNOSIS — S61211A Laceration without foreign body of left index finger without damage to nail, initial encounter: Secondary | ICD-10-CM | POA: Diagnosis not present

## 2022-03-13 DIAGNOSIS — W260XXA Contact with knife, initial encounter: Secondary | ICD-10-CM | POA: Insufficient documentation

## 2022-03-13 DIAGNOSIS — S6992XA Unspecified injury of left wrist, hand and finger(s), initial encounter: Secondary | ICD-10-CM | POA: Diagnosis present

## 2022-03-13 HISTORY — DX: Deep phlebothrombosis in pregnancy, unspecified trimester: O22.30

## 2022-03-13 MED ORDER — LIDOCAINE HCL (PF) 1 % IJ SOLN
10.0000 mL | Freq: Once | INTRAMUSCULAR | Status: AC
  Administered 2022-03-13: 10 mL
  Filled 2022-03-13: qty 10

## 2022-03-13 NOTE — ED Notes (Addendum)
Lido left in the room. Provider aware.

## 2022-03-13 NOTE — ED Notes (Signed)
Pt refused x-ray to the hand. Provider aware.

## 2022-06-22 ENCOUNTER — Other Ambulatory Visit (HOSPITAL_BASED_OUTPATIENT_CLINIC_OR_DEPARTMENT_OTHER): Payer: Self-pay

## 2022-06-22 DIAGNOSIS — Z1231 Encounter for screening mammogram for malignant neoplasm of breast: Secondary | ICD-10-CM

## 2022-06-28 ENCOUNTER — Other Ambulatory Visit (HOSPITAL_BASED_OUTPATIENT_CLINIC_OR_DEPARTMENT_OTHER): Payer: Self-pay | Admitting: Obstetrics and Gynecology

## 2022-06-28 ENCOUNTER — Encounter (HOSPITAL_BASED_OUTPATIENT_CLINIC_OR_DEPARTMENT_OTHER): Payer: Self-pay | Admitting: Radiology

## 2022-06-28 ENCOUNTER — Ambulatory Visit (HOSPITAL_BASED_OUTPATIENT_CLINIC_OR_DEPARTMENT_OTHER)
Admission: RE | Admit: 2022-06-28 | Discharge: 2022-06-28 | Disposition: A | Discharge status: Home / Self Care | Payer: 59 | Source: Ambulatory Visit | Attending: Obstetrics and Gynecology | Admitting: Obstetrics and Gynecology

## 2022-06-28 DIAGNOSIS — Z1231 Encounter for screening mammogram for malignant neoplasm of breast: Secondary | ICD-10-CM | POA: Diagnosis present

## 2022-07-05 ENCOUNTER — Inpatient Hospital Stay (HOSPITAL_BASED_OUTPATIENT_CLINIC_OR_DEPARTMENT_OTHER): Admission: RE | Admit: 2022-07-05 | Payer: 59 | Source: Ambulatory Visit | Admitting: Radiology

## 2022-12-03 ENCOUNTER — Other Ambulatory Visit (HOSPITAL_BASED_OUTPATIENT_CLINIC_OR_DEPARTMENT_OTHER): Payer: Self-pay

## 2023-11-26 ENCOUNTER — Other Ambulatory Visit: Payer: Self-pay

## 2023-11-26 ENCOUNTER — Emergency Department (HOSPITAL_BASED_OUTPATIENT_CLINIC_OR_DEPARTMENT_OTHER): Admitting: Radiology

## 2023-11-26 ENCOUNTER — Emergency Department (HOSPITAL_BASED_OUTPATIENT_CLINIC_OR_DEPARTMENT_OTHER)
Admission: EM | Admit: 2023-11-26 | Discharge: 2023-11-26 | Disposition: A | Discharge status: Home / Self Care | Attending: Emergency Medicine | Admitting: Emergency Medicine

## 2023-11-26 ENCOUNTER — Encounter (HOSPITAL_BASED_OUTPATIENT_CLINIC_OR_DEPARTMENT_OTHER): Payer: Self-pay

## 2023-11-26 DIAGNOSIS — S93492A Sprain of other ligament of left ankle, initial encounter: Secondary | ICD-10-CM | POA: Insufficient documentation

## 2023-11-26 DIAGNOSIS — S99912A Unspecified injury of left ankle, initial encounter: Secondary | ICD-10-CM | POA: Diagnosis present

## 2023-11-26 DIAGNOSIS — S8265XA Nondisplaced fracture of lateral malleolus of left fibula, initial encounter for closed fracture: Secondary | ICD-10-CM | POA: Diagnosis not present

## 2023-11-26 DIAGNOSIS — X501XXA Overexertion from prolonged static or awkward postures, initial encounter: Secondary | ICD-10-CM | POA: Diagnosis not present

## 2023-11-26 MED ORDER — HYDROCODONE-ACETAMINOPHEN 5-325 MG PO TABS: 1.0000 | ORAL_TABLET | Freq: Four times a day (QID) | ORAL | 0 refills | Status: AC | PRN

## 2023-11-26 NOTE — ED Triage Notes (Signed)
 Patient arrives POV with complaints of injuring her left ankle yesterday. Patient states that she stepping a loose piece of a door, causing her to roll her ankle. Patient took a hydrocodone 5-325mg  prior to arrival. Rates her pain a 6/10.

## 2023-11-26 NOTE — ED Notes (Signed)
 Pt given discharge instructions and reviewed prescriptions. Opportunities given for questions. Pt verbalizes understanding. Jillyn Hidden, RN

## 2023-11-26 NOTE — Discharge Instructions (Addendum)
 Please use Tylenol for pain.  You may use 1000 mg of Tylenol every 6 hours.  Not to exceed 4 g of Tylenol within 24 hours.  You can use the stronger narcotic pain medication in place of Tylenol for severe break through pain.  If you take the narcotic pain medication that we prescribed recommend that you also take a laxative such as MiraLAX or Dulcolax every day that you take the narcotic pain medicine, and drink plenty of fluids, 50 to 64 ounces to prevent any constipation.  As we discussed this is a weightbearing as tolerated injury, it is reasonable to use the crutches for a few days to take all weight off of the foot, when you are not at work or walking around I recommend keeping the leg elevated to help with swelling and pain.  When you are not wearing the boot you can apply ice directly where it hurts to help with swelling and pain.  Please follow-up with the orthopedic physician whose contact information I provided.  You are able to use the boot and put some weight on the foot long as you can tolerate the pain.

## 2023-11-26 NOTE — ED Provider Notes (Signed)
 [place] EMERGENCY DEPARTMENT AT [facility] Provider Note   CSN: [id number] Arrival date & time: 11/26/23  1015     History  Chief Complaint  Patient presents with   Ankle Injury    left    Angel Mills is a 48 y.o. female with past medical history significant for PCOS, previous DVT who presents concern for acute left ankle pain after rolling it while stepping on a loose piece of a door.  She reports that she initially felt a crack and has been unable to walk on the affected leg without significant pain, rates pain 6/10.  She took a home hydrocodone prior to arrival.   Ankle Injury       Home Medications Prior to Admission medications   Medication Sig Start Date End Date Taking? Authorizing Provider  doxycycline (VIBRAMYCIN) 100 MG capsule Take 100 mg by mouth daily. 02/18/22   [provider]  levothyroxine (SYNTHROID) 150 MCG tablet Take 150 mcg by mouth daily. 02/12/20   [provider]  Melatonin 10 MG CAPS Take by mouth.    [provider]  NURTEC 75 MG TBDP SMARTSIG:1 Tablet(s) By Mouth 02/17/22   [provider]      Allergies    Advil [ibuprofen], Aspirin, and Sulfa antibiotics    Review of Systems   Review of Systems  All other systems reviewed and are negative.   Physical Exam Updated Vital Signs BP (!) 145/78 (BP Location: Left Arm)   Pulse 86   Temp 98.2 F (36.8 C) (Oral)   Resp 20   Ht 5' 5.5" (1.664 m)   Wt 81.6 kg   LMP 11/19/2023   SpO2 100%   BMI 29.50 kg/m  Physical Exam Vitals and nursing note reviewed.  Constitutional:      General: She is not in acute distress.    Appearance: Normal appearance.  HENT:     Head: Normocephalic and atraumatic.  Eyes:     General:        Right eye: No discharge.        Left eye: No discharge.  Cardiovascular:     Rate and Rhythm: Normal rate and regular rhythm.     Pulses: Normal pulses.     Comments: DP, PT pulses are 2+, strong in the affected left  lower extremity Pulmonary:     Effort: Pulmonary effort is normal. No respiratory distress.  Musculoskeletal:        General: No deformity.     Comments: Focal tenderness to palpation and soft tissue swelling at the lateral malleolus on the left with point tenderness on the distal fibula.  Some tenderness to palpation along the entire ATFL distribution.  She has intact range of motion passively to plantarflexion, dorsiflexion, but decreased active range of motion and strength secondary to pain.  Skin:    General: Skin is warm and dry.     Capillary Refill: Capillary refill takes less than 2 seconds.  Neurological:     Mental Status: She is alert and oriented to person, place, and time.  Psychiatric:        Mood and Affect: Mood normal.        Behavior: Behavior normal.     ED Results / Procedures / Treatments   Labs (all labs ordered are listed, but only abnormal results are displayed) Labs Reviewed - No data to display  EKG None  Radiology DG Foot Complete Left Result Date: 11/26/2023 CLINICAL DATA:  left foot pain  EXAM: LEFT ANKLE COMPLETE - 3+ VIEW; LEFT FOOT - COMPLETE 3+ VIEW COMPARISON:  None Available. FINDINGS: There is a nondisplaced fracture of the distal fibula. There is associated soft tissue edema. Ankle mortise appears preserved. Mild bidirectional calcaneal enthesophytes. No additional acute fracture is noted. IMPRESSION: Nondisplaced fracture of the distal fibula. Electronically Signed   By: Meda Klinefelter M.D.   On: 11/26/2023 11:00   DG Ankle Complete Left Result Date: 11/26/2023 CLINICAL DATA:  left foot pain EXAM: LEFT ANKLE COMPLETE - 3+ VIEW; LEFT FOOT - COMPLETE 3+ VIEW COMPARISON:  None Available. FINDINGS: There is a nondisplaced fracture of the distal fibula. There is associated soft tissue edema. Ankle mortise appears preserved. Mild bidirectional calcaneal enthesophytes. No additional acute fracture is noted. IMPRESSION: Nondisplaced fracture of the distal  fibula. Electronically Signed   By: Meda Klinefelter M.D.   On: 11/26/2023 11:00    Procedures Procedures    Medications Ordered in ED Medications - No data to display  ED Course/ Medical Decision Making/ A&P                                 Medical Decision Making Amount and/or Complexity of Data Reviewed Radiology: ordered.   This patient is a 48 y.o. female who presents to the ED for concern of ankle pain.   Differential diagnoses prior to evaluation: Ankle sprain, dislocation, fracture, versus other soft tissue injury  Past Medical History / Social History / Additional history: Chart reviewed. Pertinent results include: Overall noncontributory  Physical Exam: Physical exam performed. The pertinent findings include:  DP, PT pulses are 2+, strong in the affected left lower extremity  Focal tenderness to palpation and soft tissue swelling at the lateral malleolus on the left with point tenderness on the distal fibula.  Some tenderness to palpation along the entire ATFL distribution.  She has intact range of motion passively to plantarflexion, dorsiflexion, but decreased active range of motion and strength secondary to pain.   I independently interpreted imaging including plain film radiograph of the left ankle which shows nondisplaced left distal fibula fracture. I agree with the radiologist interpretation.  Medications / Treatment: CAM Walker boot, crutches, ibuprofen, Tylenol, RICE, orthopedic follow-up   Disposition: After consideration of the diagnostic results and the patients response to treatment, I feel that patient stable for discharge with nondisplaced fracture of the distal left fibula, findings consistent with underlying ATFL sprain, plan for Ortho follow-up.   emergency department workup does not suggest an emergent condition requiring admission or immediate intervention beyond what has been performed at this time. The plan is: as above. The patient is safe for  discharge and has been instructed to return immediately for worsening symptoms, change in symptoms or any other concerns.  Final Clinical Impression(s) / ED Diagnoses Final diagnoses:  Closed nondisplaced fracture of lateral malleolus of left fibula, initial encounter  Sprain of anterior talofibular ligament of left ankle, initial encounter    Rx / DC Orders ED Discharge Orders     None         [place] 11/26/23 1110    Jacalyn Lefevre, MD 11/26/23 1117
